# Patient Record
Sex: Male | Born: 1980 | Race: Black or African American | Hispanic: No | Marital: Married | Smoking: Never smoker
Health system: Southern US, Community
[De-identification: ages and names within clinical notes are randomized; demographics above are authoritative.]

## PROBLEM LIST (undated history)

## (undated) DIAGNOSIS — T7840XA Allergy, unspecified, initial encounter: Secondary | ICD-10-CM

## (undated) HISTORY — DX: Allergy, unspecified, initial encounter: T78.40XA

---

## 2001-06-24 ENCOUNTER — Emergency Department (HOSPITAL_COMMUNITY): Admission: EM | Admit: 2001-06-24 | Discharge: 2001-06-24 | Payer: Self-pay | Admitting: Emergency Medicine

## 2001-12-21 ENCOUNTER — Emergency Department (HOSPITAL_COMMUNITY): Admission: EM | Admit: 2001-12-21 | Discharge: 2001-12-21 | Payer: Self-pay

## 2002-01-29 ENCOUNTER — Encounter: Payer: Self-pay | Admitting: Emergency Medicine

## 2002-01-29 ENCOUNTER — Emergency Department (HOSPITAL_COMMUNITY): Admission: EM | Admit: 2002-01-29 | Discharge: 2002-01-29 | Payer: Self-pay | Admitting: Emergency Medicine

## 2005-03-26 ENCOUNTER — Emergency Department (HOSPITAL_COMMUNITY): Admission: EM | Admit: 2005-03-26 | Discharge: 2005-03-26 | Payer: Self-pay | Admitting: Family Medicine

## 2006-11-15 ENCOUNTER — Ambulatory Visit: Payer: Self-pay | Admitting: Internal Medicine

## 2007-02-02 ENCOUNTER — Ambulatory Visit: Payer: Self-pay | Admitting: Internal Medicine

## 2007-02-02 LAB — CONVERTED CEMR LAB
ALT: 18 units/L (ref 0–53)
AST: 29 units/L (ref 0–37)
Albumin: 4.6 g/dL (ref 3.5–5.2)
Basophils Relative: 0.4 % (ref 0.0–1.0)
Bilirubin Urine: NEGATIVE
Calcium: 9.7 mg/dL (ref 8.4–10.5)
Chloride: 104 meq/L (ref 96–112)
Creatinine, Ser: 0.9 mg/dL (ref 0.4–1.5)
Crystals: NEGATIVE
GFR calc non Af Amer: 108 mL/min
HCT: 49.8 % (ref 39.0–52.0)
Hemoglobin, Urine: NEGATIVE
Hemoglobin: 16.8 g/dL (ref 13.0–17.0)
LDL Cholesterol: 98 mg/dL (ref 0–99)
Monocytes Absolute: 0.3 10*3/uL (ref 0.2–0.7)
Neutrophils Relative %: 66.1 % (ref 43.0–77.0)
Nitrite: NEGATIVE
RDW: 11.9 % (ref 11.5–14.6)
Sodium: 140 meq/L (ref 135–145)
Specific Gravity, Urine: 1.02 (ref 1.000–1.03)
TSH: 0.66 microintl units/mL (ref 0.35–5.50)
Total Bilirubin: 2 mg/dL — ABNORMAL HIGH (ref 0.3–1.2)
Total Protein, Urine: NEGATIVE mg/dL
VLDL: 9 mg/dL (ref 0–40)
WBC: 6.3 10*3/uL (ref 4.5–10.5)

## 2010-08-25 NOTE — Assessment & Plan Note (Signed)
[facility]                           PRIMARY CARE OFFICE NOTE   NAME:Albert Larsen, Albert Larsen                 MRN:          [id number]  DATE:11/15/2006                            DOB:          [birth year]    CHIEF COMPLAINT:  New patient practice/allergies.   HISTORY OF PRESENT ILLNESS:  The patient is a 30 year old African male  here to establish primary care.  He is originally from [place] and  moved to [place] from [place] two years  ago.  He currently works as  an Dance movement psychotherapist for [employer].  He denies any major illnesses or  hospitalizations.  He complains of chronic nasal congestion, associated  with sneezing and conjunctivitis. He has tried over-the-counter Claritin  and occasional Zyrtec with some improvement.   CURRENT MEDICATIONS:  As noted above.   ALLERGIES:  No known drug allergies.   SOCIAL HISTORY:  The patient is single.  He denies any tobacco or  alcohol use.   FAMILY HISTORY:  Mother is age 2, healthy.  Father is age 48.  Does not  have any health issues.  He denies any family history of diabetes,  premature heart disease or cancer.   HABITS:  No alcohol, no tobacco.   REVIEW OF SYSTEMS:  No fevers or chills.  HEENT:  Symptoms as noted  above. No history of chronic cough.  No history of asthma.  Denies  persistent heartburn.  No nausea, vomiting, constipation or diarrhea.  All other systems negative.   PHYSICAL EXAMINATION:  VITAL SIGNS:  Height 6 feet, weight 192 pounds,  temperature 97.8 degrees, pulse 71, blood pressure 126/82 in the left  arm in the seated position.  GENERAL:  The patient is a pleasant, well-developed and well-nourished  African male, in no apparent distress.  HEENT:  Normocephalic and atraumatic.  Pupils equal, reactive to light  bilaterally.  Extraocular muscles intact.  The patient was anicteric.  There was bilateral mild conjunctival injection.  Examination of his  nares reveals boggy  nasal turbinates.  Otherwise unremarkable.  Oropharynx also unremarkable.  NECK:  Supple without any evidence of adenopathy or carotid bruits.  CHEST:  Normal expiratory effort.  Chest clear to auscultation  bilaterally.  No rhonchi, rales or wheezing.  CARDIOVASCULAR:  A regular rate and rhythm.  No significant murmurs,  rubs or gallops appreciated.  ABDOMEN:  Soft, nontender.  Positive bowel sounds.  No organomegaly.  MUSCULOSKELETAL:  No clubbing, cyanosis or edema.  SKIN:  Warm and dry.  NEUROLOGIC:  Cranial nerves II-XII  grossly intact.  He is nonfocal.   IMPRESSION:  1. Allergic rhinitis.  2. Health maintenance.   RECOMMENDATIONS:  1. The patient is to regularly take Zyrtec 10 mg over-the-counter      q.h.s.  He was given samples of Nasonex with instructions for two      sprays in each nostril once daily.  2. He will start nasal saline irrigation twice daily.  3. We discussed possible referral to an allergist.  4. Up-to-date with last tetanus given in October 2007.   FOLLOWUP:  To follow up in two to three months.  Barbette Hair. Artist Pais, DO  Electronically Signed    RDY/MedQ  DD: 11/15/2006  DT: 11/16/2006  Job #: [id number]

## 2011-07-25 ENCOUNTER — Ambulatory Visit (INDEPENDENT_AMBULATORY_CARE_PROVIDER_SITE_OTHER): Payer: BC Managed Care – PPO | Admitting: Internal Medicine

## 2011-07-25 VITALS — BP 129/76 | HR 81 | Temp 98.5°F | Resp 16 | Ht 72.0 in | Wt 216.0 lb

## 2011-07-25 DIAGNOSIS — J069 Acute upper respiratory infection, unspecified: Secondary | ICD-10-CM

## 2011-07-25 DIAGNOSIS — J019 Acute sinusitis, unspecified: Secondary | ICD-10-CM

## 2011-07-25 DIAGNOSIS — H9201 Otalgia, right ear: Secondary | ICD-10-CM

## 2011-07-25 DIAGNOSIS — H6120 Impacted cerumen, unspecified ear: Secondary | ICD-10-CM

## 2011-07-25 DIAGNOSIS — H9209 Otalgia, unspecified ear: Secondary | ICD-10-CM

## 2011-07-25 MED ORDER — AMOXICILLIN 875 MG PO TABS: 875.0000 mg | ORAL_TABLET | Freq: Two times a day (BID) | ORAL | Status: AC

## 2011-07-25 NOTE — Progress Notes (Signed)
  Subjective:    Patient ID: Albert Larsen, male    DOB: June 10, 1980, 31 y.o.   MRN: [id number]  HPIRight ear pain with decreased hearing for 5 days Congested for 2 weeks/no response to allergy medicines/has hoarseness but no cough and no sore throat/discharge from nose now purulent    Review of Systems     Objective:   Physical Exam Vital signs stable Right ear canal occluded by dark dry wax Left ear canal clear Manipulation of the right auricle is painful/no swelling Nares are boggy with purulent discharge/bilateral maxillary sinus tenderness to percussion Throat clear No nodes Chest clear      Procedure: Irrigation of the right ear with 75% successful/canal is clear of any infection/TM intact Assessment & Plan:  Problem #1 otalgia secondary to cerumen impaction Problem #2 allergic rhinitis versus URI Problem #3 acute sinusitis  Amoxicillin 875 twice a day for 10 days Claritin-D OTC Mineral drops right ear Followup if not well one week

## 2012-03-28 ENCOUNTER — Encounter: Payer: Self-pay | Admitting: Family Medicine

## 2012-03-28 ENCOUNTER — Ambulatory Visit (INDEPENDENT_AMBULATORY_CARE_PROVIDER_SITE_OTHER): Payer: BC Managed Care – PPO | Admitting: Family Medicine

## 2012-03-28 VITALS — BP 115/75 | HR 80 | Temp 98.6°F | Resp 16 | Ht 72.5 in | Wt 218.6 lb

## 2012-03-28 DIAGNOSIS — F32A Depression, unspecified: Secondary | ICD-10-CM

## 2012-03-28 DIAGNOSIS — G4700 Insomnia, unspecified: Secondary | ICD-10-CM

## 2012-03-28 DIAGNOSIS — F329 Major depressive disorder, single episode, unspecified: Secondary | ICD-10-CM

## 2012-03-28 DIAGNOSIS — F4321 Adjustment disorder with depressed mood: Secondary | ICD-10-CM

## 2012-03-28 MED ORDER — MIRTAZAPINE 15 MG PO TABS: 15.0000 mg | ORAL_TABLET | Freq: Every day | ORAL | Status: DC

## 2012-03-28 NOTE — Patient Instructions (Addendum)

## 2012-03-28 NOTE — Progress Notes (Signed)
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  He comes in with a problem of insomnia for about 4 days.  His mind is racing.  He has had this problem before.  Every 4 to 6 months this happens. Currently married(she lives in [place]), no children. Originally, patient is from [place].  He thinks he is a bit depressed.  He denies suicidal ideation.  Objective:  Spent 15 minutes face to face.  Assessment:  Mild depression with insomnia.  Plan:  Remeron 15 mg qhs.

## 2012-04-11 ENCOUNTER — Telehealth: Payer: Self-pay

## 2012-04-11 NOTE — Telephone Encounter (Signed)
wants Dr Milus Glazier to call about his meds, he took the meds for sleep (Remeron 15mg )and this helped for first few days, but now is not helping, wants to know if something else can be sent to [facility]

## 2012-04-11 NOTE — Telephone Encounter (Signed)
Patient would like to talk with dr Clelia Croft at her earliest [place] [phone]

## 2012-04-13 NOTE — Telephone Encounter (Signed)
Called patient to advise. I have advised patient of Dr Milus Glazier schedule.

## 2012-04-13 NOTE — Telephone Encounter (Signed)
He needs to be seen for this

## 2012-08-18 ENCOUNTER — Ambulatory Visit (INDEPENDENT_AMBULATORY_CARE_PROVIDER_SITE_OTHER): Payer: BC Managed Care – PPO | Admitting: Family Medicine

## 2012-08-18 VITALS — BP 142/78 | HR 76 | Temp 98.9°F | Resp 16 | Ht 72.5 in | Wt 218.0 lb

## 2012-08-18 DIAGNOSIS — H00016 Hordeolum externum left eye, unspecified eyelid: Secondary | ICD-10-CM

## 2012-08-18 DIAGNOSIS — H00019 Hordeolum externum unspecified eye, unspecified eyelid: Secondary | ICD-10-CM

## 2012-08-18 MED ORDER — CIPROFLOXACIN HCL 0.3 % OP SOLN: OPHTHALMIC | Status: DC

## 2012-08-18 NOTE — Progress Notes (Signed)
[facility] [address] [place] [address] [phone]  Date:  08/18/2012   Name:  Albert Larsen   DOB:  [birth year]   MRN:  [id number]  PCP:  Tally Due, MD    Chief Complaint: Stye   History of Present Illness:  Albert Larsen is a 32 y.o. very pleasant male patient who presents with the following:  He is here today with a concern regarding his left eye.  He has noted some itching but no tenderness in the left upper lid for 2 or 3 days.  He is able to feel a bump there.  This reminds him of when he had a stye in the past He has had some itchy eyes recently due to allergies.  Both eyes are watering some.   His vision seems normal to him.   He does not wear corrective lenses.    He is taking zyrtec for allergies.  He is otherwise generally healthy.    There are no active problems to display for this patient.   No past medical history on file.  No past surgical history on file.  History  Substance Use Topics  . Smoking status: Never Smoker   . Smokeless tobacco: Not on file  . Alcohol Use: No    No family history on file.  No Known Allergies  Medication list has been reviewed and updated.  Current Outpatient Prescriptions on File Prior to Visit  Medication Sig Dispense Refill  . mirtazapine (REMERON) 15 MG tablet Take 1 tablet (15 mg total) by mouth at bedtime.  30 tablet  5   No current facility-administered medications on file prior to visit.    Review of Systems:  As per HPI- otherwise negative. He has not tried any treatments as of yet  Physical Examination: Filed Vitals:   08/18/12 1141  BP: 142/78  Pulse: 76  Temp: 98.9 F (37.2 C)  Resp: 16   Filed Vitals:   08/18/12 1141  Height: 6' 0.5" (1.842 m)  Weight: 218 lb (98.884 kg)   Body mass index is 29.14 kg/(m^2). Ideal Body Weight: Weight in (lb) to have BMI = 25: 186.5  GEN: WDWN, NAD, Non-toxic, A & O x 3, looks well HEENT: Atraumatic,  Normocephalic. Neck supple. No masses, No LAD. Bilateral TM wnl, oropharynx normal.  PEERL,EOMI.  There is a small stye visible on the left upper lid.  It appears to be coming to a head.  No conjunctival injection.  Normal fundoscopic exam Ears and Nose: No external deformity. CV: RRR, No M/G/R. No JVD. No thrill. No extra heart sounds. PULM: CTA B, no wheezes, crackles, rhonchi. No retractions. No resp. distress. No accessory muscle use. EXTR: No c/c/e NEURO Normal gait.  PSYCH: Normally interactive. Conversant. Not depressed or anxious appearing.  Calm demeanor.    Assessment and Plan: Stye, left - Plan: ciprofloxacin (CILOXAN) 0.3 % ophthalmic solution  Treat with hot compresses.  rx for cipro drops per his request.  He had used abx drops the last time he had a stye and they seemed to help.  See patient instructions for more details.     Signed Abbe Amsterdam, MD

## 2012-08-18 NOTE — Patient Instructions (Addendum)
Apply hot compresses to your eye frequently.  If your eye is not better in the next couple of days please let me know- Sooner if worse.   Use the eye drop as directed.

## 2012-09-09 ENCOUNTER — Ambulatory Visit (INDEPENDENT_AMBULATORY_CARE_PROVIDER_SITE_OTHER): Payer: BC Managed Care – PPO | Admitting: Family Medicine

## 2012-09-09 VITALS — BP 145/81 | HR 79 | Temp 98.5°F | Resp 18 | Ht 72.0 in | Wt 216.0 lb

## 2012-09-09 DIAGNOSIS — R300 Dysuria: Secondary | ICD-10-CM

## 2012-09-09 LAB — POCT UA - MICROSCOPIC ONLY
Casts, Ur, LPF, POC: NEGATIVE
Crystals, Ur, HPF, POC: NEGATIVE
Epithelial cells, urine per micros: NEGATIVE
Mucus, UA: POSITIVE
Yeast, UA: NEGATIVE

## 2012-09-09 LAB — POCT URINALYSIS DIPSTICK
Bilirubin, UA: NEGATIVE
Glucose, UA: NEGATIVE
Ketones, UA: NEGATIVE
Leukocytes, UA: NEGATIVE
Nitrite, UA: NEGATIVE
Protein, UA: NEGATIVE
Spec Grav, UA: 1.01
Urobilinogen, UA: 0.2
pH, UA: 5.5

## 2012-09-09 MED ORDER — DOXYCYCLINE HYCLATE 100 MG PO CAPS: 100.0000 mg | ORAL_CAPSULE | Freq: Two times a day (BID) | ORAL | Status: DC

## 2012-09-09 MED ORDER — CEFTRIAXONE SODIUM 1 G IJ SOLR
500.0000 mg | Freq: Once | INTRAMUSCULAR | Status: AC
Start: 2012-09-09 — End: 2012-09-09
  Administered 2012-09-09: 500 mg via INTRAMUSCULAR

## 2012-09-09 NOTE — Progress Notes (Signed)
[address]   [place], [place]  [address]   [phone]  Subjective:    Patient ID: Albert Larsen, male    DOB: 1980/07/06, 32 y.o.   MRN: [id number]  HPI This 32 y.o. male presents for evaluation of dysuria.  Onset three days ago.  Prior to urination and after, suffering with pain.  No penile discharge.  Increased fluid intake.  Pain has improved.  No fever but +night sweats chronic.  No chills. No abdominal pain; no n/v.  No hematuria.  No frequency; no urgency.  No testicular pain or swelling.  Sexually active; old partner x 4 years.  History of STD six years ago.  Last STD check four years ago.  No lower back pain.     Review of Systems  Constitutional: Positive for diaphoresis. Negative for fever, chills and fatigue.  Gastrointestinal: Negative for nausea, vomiting, abdominal pain and diarrhea.  Genitourinary: Positive for dysuria. Negative for urgency, frequency, hematuria, flank pain, discharge, penile swelling, scrotal swelling, genital sores, penile pain and testicular pain.  Musculoskeletal: Negative for back pain.    History reviewed. No pertinent past medical history.  History reviewed. No pertinent past surgical history.  Prior to Admission medications   Not on File    No Known Allergies  History   Social History  . Marital Status: Single    Spouse Name: N/A    Number of Children: N/A  . Years of Education: N/A   Occupational History  . Not on file.   Social History Main Topics  . Smoking status: Never Smoker   . Smokeless tobacco: Not on file  . Alcohol Use: No  . Drug Use: No  . Sexually Active: Not on file   Other Topics Concern  . Not on file   Social History Narrative  . No narrative on file    History reviewed. No pertinent family history.     Objective:   Physical Exam  Nursing note and vitals reviewed. Constitutional: He is oriented to person, place, and time. He appears well-developed and well-nourished. No distress.    Cardiovascular: Normal rate, regular rhythm and normal heart sounds.   No murmur heard. Pulmonary/Chest: Effort normal and breath sounds normal.  Abdominal: Soft. Bowel sounds are normal. He exhibits no distension and no mass. There is no tenderness. There is no rebound and no guarding. Hernia confirmed negative in the right inguinal area and confirmed negative in the left inguinal area.  Genitourinary: Penis normal. Right testis shows no mass, no swelling and no tenderness. Right testis is descended. Left testis shows no mass, no swelling and no tenderness. Left testis is descended. No penile tenderness.  Lymphadenopathy:       Right: No inguinal adenopathy present.       Left: No inguinal adenopathy present.  Neurological: He is alert and oriented to person, place, and time.  Skin: He is not diaphoretic.  Psychiatric: He has a normal mood and affect. His behavior is normal.        Results for orders placed in visit on 09/09/12  POCT URINALYSIS DIPSTICK      Result Value Range   Color, UA yellow     Clarity, UA clear     Glucose, UA neg     Bilirubin, UA neg     Ketones, UA neg     Spec Grav, UA 1.010     Blood, UA trace     pH, UA 5.5     Protein,  UA neg     Urobilinogen, UA 0.2     Nitrite, UA neg     Leukocytes, UA Negative    POCT UA - MICROSCOPIC ONLY      Result Value Range   WBC, Ur, HPF, POC 0-1     RBC, urine, microscopic 2-3     Bacteria, U Microscopic trace     Mucus, UA pos     Epithelial cells, urine per micros neg     Crystals, Ur, HPF, POC neg     Casts, Ur, LPF, POC neg     Yeast, UA neg      Assessment & Plan:  Dysuria - Plan: POCT urinalysis dipstick, POCT UA - Microscopic Only, Urine culture, GC/Chlamydia Probe Amp, cefTRIAXone (ROCEPHIN) injection 500 mg, CANCELED: POCT urine pregnancy   1.  Dysuria;  New. Benign u/a in office; send urine culture and Genprobe.  Treat empirically with Rocephin 500mg  IM in office; rx for Doxycycline provided.   Declined further STD screening; has CPE scheduled in upcoming month.  Meds ordered this encounter  Medications  . cefTRIAXone (ROCEPHIN) injection 500 mg    Sig:   . doxycycline (VIBRAMYCIN) 100 MG capsule    Sig: Take 1 capsule (100 mg total) by mouth 2 (two) times daily.    Dispense:  20 capsule    Refill:  0

## 2012-09-09 NOTE — Patient Instructions (Addendum)

## 2012-09-10 LAB — URINE CULTURE: Organism ID, Bacteria: NO GROWTH

## 2012-09-20 ENCOUNTER — Telehealth: Payer: Self-pay

## 2012-09-20 NOTE — Telephone Encounter (Signed)
Dr. Katrinka Blazing, pt said he had been calling for several days for his lab results, so I let him know that everything was neg. Is there anything else that you needed to tell him?

## 2012-09-22 NOTE — Telephone Encounter (Signed)
How is he feeling?  Is he feeling better?  Any further dysuria?

## 2012-09-22 NOTE — Telephone Encounter (Signed)
Pt states that he has been drinking plenty of water and he has not been having any more dysuria since Saturday. Dr Katrinka Blazing, [place].

## 2013-01-03 ENCOUNTER — Other Ambulatory Visit: Payer: Self-pay | Admitting: Physician Assistant

## 2013-01-03 ENCOUNTER — Ambulatory Visit (INDEPENDENT_AMBULATORY_CARE_PROVIDER_SITE_OTHER): Payer: BC Managed Care – PPO | Admitting: Physician Assistant

## 2013-01-03 VITALS — BP 132/80 | HR 85 | Temp 99.7°F | Resp 16 | Ht 73.0 in | Wt 213.0 lb

## 2013-01-03 DIAGNOSIS — E0590 Thyrotoxicosis, unspecified without thyrotoxic crisis or storm: Secondary | ICD-10-CM

## 2013-01-03 DIAGNOSIS — R5383 Other fatigue: Secondary | ICD-10-CM

## 2013-01-03 DIAGNOSIS — Z0000 Encounter for general adult medical examination without abnormal findings: Secondary | ICD-10-CM

## 2013-01-03 DIAGNOSIS — R5381 Other malaise: Secondary | ICD-10-CM

## 2013-01-03 DIAGNOSIS — Z23 Encounter for immunization: Secondary | ICD-10-CM

## 2013-01-03 LAB — POCT URINALYSIS DIPSTICK
Bilirubin, UA: NEGATIVE
Glucose, UA: NEGATIVE
Ketones, UA: NEGATIVE
Leukocytes, UA: NEGATIVE
Nitrite, UA: NEGATIVE
Protein, UA: NEGATIVE
Spec Grav, UA: >=1.03
Urobilinogen, UA: 0.2
pH, UA: 5.5

## 2013-01-03 LAB — POCT CBC
Lymph, poc: 1.4 (ref 0.6–3.4)
MCH, POC: 28.9 pg (ref 27–31.2)
MCHC: 32.5 g/dL (ref 31.8–35.4)
MCV: 89 fL (ref 80–97)
MID (cbc): 0.4 (ref 0–0.9)
MPV: 9.2 fL (ref 0–99.8)
POC LYMPH PERCENT: 30.5 %L (ref 10–50)
POC MID %: 8 %M (ref 0–12)
Platelet Count, POC: 204 10*3/uL (ref 142–424)
RBC: 5.5 M/uL (ref 4.69–6.13)
WBC: 4.6 10*3/uL (ref 4.6–10.2)

## 2013-01-03 LAB — LIPID PANEL
Cholesterol: 122 mg/dL (ref 0–200)
Total CHOL/HDL Ratio: 3.5 Ratio
Triglycerides: 59 mg/dL (ref <150)
VLDL: 12 mg/dL (ref 0–40)

## 2013-01-03 LAB — COMPREHENSIVE METABOLIC PANEL
Alkaline Phosphatase: 79 U/L (ref 39–117)
CO2: 27 mEq/L (ref 19–32)
Creat: 0.84 mg/dL (ref 0.50–1.35)
Glucose, Bld: 104 mg/dL — ABNORMAL HIGH (ref 70–99)
Total Bilirubin: 0.7 mg/dL (ref 0.3–1.2)

## 2013-01-03 LAB — POCT UA - MICROSCOPIC ONLY
Casts, Ur, LPF, POC: NEGATIVE
Mucus, UA: POSITIVE
Yeast, UA: NEGATIVE

## 2013-01-03 NOTE — Progress Notes (Signed)
I have examined this patient along with the student and agree. Age appropriate anticipatory guidance provided. Would add HIV testing if fatigue persists.

## 2013-01-03 NOTE — Patient Instructions (Signed)
I will contact you with your lab results as soon as they are available.   If you have not heard from me in 2 weeks, please contact me.  The fastest way to get your results is to register for My Chart (see the instructions on the last page of this printout).  Keeping you healthy  Get these tests  Blood pressure- Have your blood pressure checked once a year by your healthcare provider.  Normal blood pressure is 120/80.  Weight- Have your body mass index (BMI) calculated to screen for obesity.  BMI is a measure of body fat based on height and weight. You can also calculate your own BMI at www.nhlbisupport.com/bmi/.  Cholesterol- Have your cholesterol checked regularly starting at age 35, sooner may be necessary if you have diabetes, high blood pressure, if a family member developed heart diseases at an early age or if you smoke.   Chlamydia, HIV, and other sexual transmitted disease- Get screened each year until the age of 25 then within three months of each new sexual partner.  Diabetes- Have your blood sugar checked regularly if you have high blood pressure, high cholesterol, a family history of diabetes or if you are overweight.  Get these vaccines  Flu shot- Every fall.  Tetanus shot- Every 10 years.  Menactra- Single dose; prevents meningitis.  Take these steps  Don't smoke- If you do smoke, ask your healthcare provider about quitting. For tips on how to quit, go to www.smokefree.gov or call 1-800-QUIT-NOW.  Be physically active- Exercise 5 days a week for at least 30 minutes.  If you are not already physically active start slow and gradually work up to 30 minutes of moderate physical activity.  Examples of moderate activity include walking briskly, mowing the yard, dancing, swimming bicycling, etc.  Eat a healthy diet- Eat a variety of healthy foods such as fruits, vegetables, low fat milk, low fat cheese, yogurt, lean meats, poultry, fish, beans, tofu, etc.  For more information  on healthy eating, go to www.thenutritionsource.org  Drink alcohol in moderation- Limit alcohol intake two drinks or less a day.  Never drink and drive.  Dentist- Brush and floss teeth twice daily; visit your dentis twice a year.  Depression-Your emotional health is as important as your physical health.  If you're feeling down, losing interest in things you normally enjoy please talk with your healthcare provider.  Gun Safety- If you keep a gun in your home, keep it unloaded and with the safety lock on.  Bullets should be stored separately.  Helmet use- Always wear a helmet when riding a motorcycle, bicycle, rollerblading or skateboarding.  Safe sex- If you may be exposed to a sexually transmitted infection, use a condom  Seat belts- Seat bels can save your life; always wear one.  Smoke/Carbon Monoxide detectors- These detectors need to be installed on the appropriate level of your home.  Replace batteries at least once a year.  Skin Cancer- When out in the sun, cover up and use sunscreen SPF 15 or higher.  Violence- If anyone is threatening or hurting you, please tell your healthcare provider. 

## 2013-01-03 NOTE — Progress Notes (Signed)
Subjective:    Patient ID: Albert Larsen, male    DOB: 1980-06-08, 32 y.o.   MRN: [id number]  HPI  [name] is here today for a CPE. He has no known medical conditions and takes no medications daily. He suffers from seasonal allergies but has not had to take anything for his allergies this fall. He notes some ongoing fatigue for the past few weeks. He states that his sleeping pattern is normal but feels tired all the time. Patient denies changes in mood but does state that he has only been going to work and then home lately. He believes he is overall happy but doesn't feel motivated to do more lately.   Patient also complains of b/l knee pain. Pain is more of an ache or pressure. He does not remember injuring his knees or anything that precipitated the knee pain. He has not taken anything to help. He has not been exercising lately either.  [name] denies smoking, alcohol, and illicit drug use. He is currently sexually active with one partner. He has no concerns for STIs. Patient had recent G/C testing on 09/09/12 that was negative.   Family medical history is limited since his parents live in another country but he believes that they are healthy.   Review of Systems  Constitutional: Positive for appetite change and fatigue. Negative for chills and unexpected weight change.  HENT: Positive for congestion. Negative for hearing loss, ear pain, sore throat, rhinorrhea, sneezing, mouth sores, neck pain, neck stiffness, dental problem, postnasal drip and sinus pressure.   Respiratory: Negative for cough, chest tightness and shortness of breath.   Cardiovascular: Negative for chest pain and palpitations.  Gastrointestinal: Negative for nausea, vomiting, abdominal pain, diarrhea, constipation and blood in stool.  Genitourinary: Negative for dysuria, urgency, frequency, hematuria and discharge.  Musculoskeletal: Positive for arthralgias. Negative for back pain, joint swelling and  gait problem.  Skin: Negative for color change and rash.  Allergic/Immunologic: Positive for environmental allergies. Negative for food allergies.  Neurological: Negative for weakness, light-headedness, numbness and headaches.  Psychiatric/Behavioral: Negative for suicidal ideas, sleep disturbance and dysphoric mood. The patient is not nervous/anxious.     Filed Vitals:   01/03/13 0833  BP: 132/80  Pulse: 85  Temp: 99.7 F (37.6 C)  TempSrc: Oral  Resp: 16  Height: 6\' 1"  (1.854 m)  Weight: 213 lb (96.616 kg)  SpO2: 100%     Objective:   Physical Exam  Constitutional: He is oriented to person, place, and time. He appears well-developed and well-nourished.  HENT:  Head: Normocephalic and atraumatic.  Right Ear: Hearing normal. No drainage or tenderness. Right ear foreign body: moderate cerumen in ear canal.  Left Ear: Hearing, tympanic membrane and external ear normal. No drainage or tenderness.  Nose: Nose normal. No rhinorrhea.  Mouth/Throat: Uvula is midline, oropharynx is clear and moist and mucous membranes are normal.  Eyes: Conjunctivae and EOM are normal. Pupils are equal, round, and reactive to light. Right eye exhibits no discharge. Left eye exhibits no discharge. No scleral icterus.  Neck: Trachea normal and normal range of motion. Carotid bruit is not present. No mass present.  Cardiovascular: Normal rate, regular rhythm, S1 normal, S2 normal, normal heart sounds, intact distal pulses and normal pulses.   Pulmonary/Chest: Effort normal and breath sounds normal.  Abdominal: Soft. Normal appearance and bowel sounds are normal. There is no hepatosplenomegaly. There is no tenderness. No hernia. Hernia confirmed negative in the right inguinal area and  confirmed negative in the left inguinal area.  Genitourinary: Testes normal and penis normal. Right testis shows no mass and no tenderness. Left testis shows no mass and no tenderness. Circumcised. No penile tenderness. No  discharge found.  Musculoskeletal: Normal range of motion.       Right knee: Normal. He exhibits no swelling, no effusion, no LCL laxity, normal patellar mobility and no MCL laxity. No tenderness found.       Left knee: Normal. He exhibits normal range of motion, no swelling, no effusion, no erythema, no LCL laxity and no MCL laxity.  Lymphadenopathy:    He has no cervical adenopathy.       Right: No inguinal adenopathy present.       Left: No inguinal adenopathy present.  Neurological: He is alert and oriented to person, place, and time. He has normal strength. No cranial nerve deficit or sensory deficit. He displays a negative Romberg sign. Coordination normal.  Reflex Scores:      Tricep reflexes are 2+ on the right side and 2+ on the left side.      Brachioradialis reflexes are 2+ on the right side and 2+ on the left side. Unable to elicit patellar reflexes b/l          Assessment & Plan:  Routine general medical examination at a health care facility - Plan: POCT UA - Microscopic Only, POCT urinalysis dipstick, Lipid panel  Fatigue - Plan: POCT CBC, TSH, Comprehensive metabolic panel, POCT glucose (manual entry)  Need for influenza vaccination - Plan: Flu Vaccine QUAD 36+ mos IM  Need for Tdap vaccination - Plan: Tdap vaccine greater than or equal to 7yo IM

## 2013-01-06 ENCOUNTER — Telehealth: Payer: Self-pay

## 2013-01-06 LAB — T3, FREE: T3, Free: 9.8 pg/mL — ABNORMAL HIGH (ref 2.3–4.2)

## 2013-01-06 LAB — T4: T4, Total: 13.1 ug/dL — ABNORMAL HIGH (ref 5.0–12.5)

## 2013-01-06 NOTE — Telephone Encounter (Signed)
I let him know that some of labs were still pending and that we would call him as soon as everything was back. He asked about STD and I relayed to him that he said he had had no concerns at his OV. However, he does want and HIV if we can add that on? Thanks

## 2013-01-06 NOTE — Telephone Encounter (Signed)
Patient is calling about his lab results. [phone]

## 2013-01-08 LAB — HIV ANTIBODY (ROUTINE TESTING W REFLEX): HIV: NONREACTIVE

## 2013-01-08 NOTE — Telephone Encounter (Signed)
See other message in result notes.  Added on HIV, needs thyroid US and referral to endocrinology due to hyperthyroidism.  Orders placed.

## 2013-01-08 NOTE — Addendum Note (Signed)
Addended by: Fernande Bras on: 01/08/2013 08:32 AM   Modules accepted: Orders

## 2013-01-12 ENCOUNTER — Ambulatory Visit
Admission: RE | Admit: 2013-01-12 | Discharge: 2013-01-12 | Disposition: A | Discharge status: Home / Self Care | Payer: BC Managed Care – PPO | Source: Ambulatory Visit | Attending: Physician Assistant | Admitting: Physician Assistant

## 2013-01-12 DIAGNOSIS — E0590 Thyrotoxicosis, unspecified without thyrotoxic crisis or storm: Secondary | ICD-10-CM

## 2013-01-15 ENCOUNTER — Encounter: Payer: Self-pay | Admitting: Endocrinology

## 2013-01-15 ENCOUNTER — Other Ambulatory Visit: Payer: Self-pay | Admitting: Endocrinology

## 2013-01-15 ENCOUNTER — Ambulatory Visit (INDEPENDENT_AMBULATORY_CARE_PROVIDER_SITE_OTHER): Payer: BC Managed Care – PPO | Admitting: Endocrinology

## 2013-01-15 VITALS — BP 116/70 | HR 67 | Temp 98.4°F | Resp 12 | Ht 72.0 in | Wt 213.6 lb

## 2013-01-15 DIAGNOSIS — E0590 Thyrotoxicosis, unspecified without thyrotoxic crisis or storm: Secondary | ICD-10-CM

## 2013-01-15 DIAGNOSIS — R7301 Impaired fasting glucose: Secondary | ICD-10-CM

## 2013-01-15 LAB — T4, FREE: Free T4: 0.98 ng/dL (ref 0.80–1.80)

## 2013-01-15 NOTE — Progress Notes (Signed)
Patient ID: Albert Larsen, male   DOB: 22-Apr-1980, 32 y.o.   MRN: [id number]  Reason for Appointment:  Hyperthyroidism, new visit    History of Present Illness:   The Hyperthyroidism was first diagnosed in 9/14 on routine lab work for his physical   The symptoms consistent with hyperthyroidism are: Some sweating at night and feeling tired at times. He does not have any  palpitations, shakiness, nervousness, sensation of feeling excessively warm or having diarrhea. The patient has had a slow weight gain over the last few months Also did not have any history of neck discomfort or swelling recently  His labs are indicative of hyperthyroidism.  Lab Results  Component Value Date   TSH 0.033* 01/03/2013   T4TOTAL 13.1* 01/03/2013   He is now referred here for further evaluation.   No visits with results within 1 Week(s) from this visit. Latest known visit with results is:  Office Visit on 01/03/2013  Component Date Value Range Status  . WBC 01/03/2013 4.6  4.6 - 10.2 K/uL Final  . Lymph, poc 01/03/2013 1.4  0.6 - 3.4 Final  . POC LYMPH PERCENT 01/03/2013 30.5  10 - 50 %L Final  . MID (cbc) 01/03/2013 0.4  0 - 0.9 Final  . POC MID % 01/03/2013 8.0  0 - 12 %M Final  . POC Granulocyte 01/03/2013 2.8  2 - 6.9 Final  . Granulocyte percent 01/03/2013 61.5  37 - 80 %G Final  . RBC 01/03/2013 5.50  4.69 - 6.13 M/uL Final  . Hemoglobin 01/03/2013 15.9  14.1 - 18.1 g/dL Final  . HCT, POC 46/96/2952 48.9  43.5 - 53.7 % Final  . MCV 01/03/2013 89.0  80 - 97 fL Final  . MCH, POC 01/03/2013 28.9  27 - 31.2 pg Final  . MCHC 01/03/2013 32.5  31.8 - 35.4 g/dL Final  . RDW, POC 84/13/2440 13.9   Final  . Platelet Count, POC 01/03/2013 204  142 - 424 K/uL Final  . MPV 01/03/2013 9.2  0 - 99.8 fL Final  . TSH 01/03/2013 0.033* 0.350 - 4.500 uIU/mL Final  . WBC, Ur, HPF, POC 01/03/2013 0-2   Final  . RBC, urine, microscopic 01/03/2013 0-4   Final  . Bacteria, U Microscopic 01/03/2013 Trace    Corrected  . Mucus, UA 01/03/2013 Positive   Final  . Epithelial cells, urine per micros 01/03/2013 0-3   Final  . Crystals, Ur, HPF, POC 01/03/2013 Triple Phosphate   Final  . Casts, Ur, LPF, POC 01/03/2013 Neg   Final  . Yeast, UA 01/03/2013 Neg   Final  . Color, UA 01/03/2013 yellow   Final  . Clarity, UA 01/03/2013 clear   Final  . Glucose, UA 01/03/2013 neg   Final  . Bilirubin, UA 01/03/2013 neg   Final  . Ketones, UA 01/03/2013 neg   Final  . Spec Grav, UA 01/03/2013 >=1.030   Final  . Blood, UA 01/03/2013 small   Final  . pH, UA 01/03/2013 5.5   Final  . Protein, UA 01/03/2013 neg   Final  . Urobilinogen, UA 01/03/2013 0.2   Final  . Nitrite, UA 01/03/2013 neg   Final  . Leukocytes, UA 01/03/2013 Negative   Final  . Sodium 01/03/2013 139  135 - 145 mEq/L Final  . Potassium 01/03/2013 4.1  3.5 - 5.3 mEq/L Final  . Chloride 01/03/2013 106  96 - 112 mEq/L Final  . CO2 01/03/2013 27  19 - 32 mEq/L Final  .  Glucose, Bld 01/03/2013 104* 70 - 99 mg/dL Final  . BUN 16/01/9603 12  6 - 23 mg/dL Final  . Creat 54/12/8117 0.84  0.50 - 1.35 mg/dL Final  . Total Bilirubin 01/03/2013 0.7  0.3 - 1.2 mg/dL Final  . Alkaline Phosphatase 01/03/2013 79  39 - 117 U/L Final  . AST 01/03/2013 17  0 - 37 U/L Final  . ALT 01/03/2013 27  0 - 53 U/L Final  . Total Protein 01/03/2013 7.8  6.0 - 8.3 g/dL Final  . Albumin 14/78/2956 4.6  3.5 - 5.2 g/dL Final  . Calcium 21/30/8657 9.8  8.4 - 10.5 mg/dL Final  . Cholesterol 84/69/6295 122  0 - 200 mg/dL Final   Comment: ATP III Classification:                                < 200        mg/dL        Desirable                               200 - 239     mg/dL        Borderline High                               >= 240        mg/dL        High                             . Triglycerides 01/03/2013 59  <150 mg/dL Final  . HDL 28/41/3244 35* >39 mg/dL Final  . Total CHOL/HDL Ratio 01/03/2013 3.5   Final  . VLDL 01/03/2013 12  0 - 40 mg/dL Final  . LDL  Cholesterol 01/03/2013 75  0 - 99 mg/dL Final   Comment:                            Total Cholesterol/HDL Ratio:CHD Risk                                                 Coronary Heart Disease Risk Table                                                                 Men       Women                                   1/2 Average Risk              3.4        3.3  Average Risk              5.0        4.4                                    2X Average Risk              9.6        7.1                                    3X Average Risk             23.4       11.0                          Use the calculated Patient Ratio above and the CHD Risk table                           to determine the patient's CHD Risk.                          ATP III Classification (LDL):                                < 100        mg/dL         Optimal                               100 - 129     mg/dL         Near or Above Optimal                               130 - 159     mg/dL         Borderline High                               160 - 189     mg/dL         High                                > 190        mg/dL         Very High                             . POC Glucose 01/03/2013 108* 70 - 99 mg/dl Final      Medication List    Notice As of 01/15/2013  9:46 AM   You have not been prescribed any medications.          Past Medical History  Diagnosis Date  . Allergy     History reviewed. No pertinent past surgical history.  History reviewed. No pertinent family history.  Social History:  reports that he has never smoked. He does not have any  smokeless tobacco history on file. He reports that he does not drink alcohol or use illicit drugs.  Allergies: No Known Allergies  Review of Systems:  CARDIOLOGY: no history of high blood pressure.       RESPIRATORY:  no dyspnea on exertion.      GASTROENTEROLOGY:  no Change in bowel habits.      ENDOCRINOLOGY:  no history of  Diabetes but his fasting glucose is 104 No numbness or attending in his feet              Examination:   BP 116/70  Pulse 67  Temp(Src) 98.4 F (36.9 C)  Resp 12  Ht 6' (1.829 m)  Wt 213 lb 9.6 oz (96.888 kg)  BMI 28.96 kg/m2  SpO2 97%   General Appearance:  well-built and nourished, pleasant, not anxious or hyperkinetic..        Eyes: No excessive prominence, lid lag or stare. No swelling of the eyelids  Neck: The thyroid is barely palpable on the right side, soft and no nodules felt on either side. There is no lymphadenopathy .          Heart: normal S1 and S2, no murmurs .         Lungs: breath sounds are clear bilaterally  Extremities: hands are warm. No ankle edema. Neurological: REFLEXES: at biceps are difficult to elicit.  TREMORS:  no tremors are present..    Assessments/Plan    Hyperthyroidism, subclinical with minimal or no symptoms  He has a relatively high free T3 level and only mild increase in total T4 The differential diagnosis includes early Graves' disease, toxic nodule or silent thyroiditis  The thyroid nodule on the right side is relatively small and usually small nodules are not able to cause hyperthyroidism. Will need to distinguish Graves' disease from other causes by doing a technetium nuclear scan and also get an I-131 uptake Treatment will depend on the results of above testing  Thyroid nodule: If this is cold will discuss the possibility of needle aspiration with radiologist for definitive diagnosis  IMPAIRED fasting glucose: He has mild increase in fasting glucose, apparently this is not new. Discussed role of regular exercise and diet as well as weight loss in managing this    Margret Moat 01/15/2013, 9:46 AM

## 2013-01-16 ENCOUNTER — Telehealth: Payer: Self-pay | Admitting: *Deleted

## 2013-01-16 NOTE — Telephone Encounter (Signed)
noted 

## 2013-01-16 NOTE — Telephone Encounter (Signed)
Pt wanted to know his results from last visit

## 2013-01-16 NOTE — Progress Notes (Signed)
Quick Note:  Please let patient know that the thyroid result is normal now and no treatment will be needed but I will need to discuss his ultrasound with him, left message on cell phone ______

## 2013-01-16 NOTE — Telephone Encounter (Signed)
Message copied by Hermenia Bers on Tue Jan 16, 2013  2:22 PM ------      Message from: Reather Littler      Created: Tue Jan 16, 2013  1:29 PM       Please let patient know that the thyroid result is normal now and no treatment will be needed but I will need to discuss his ultrasound with him, left message on cell phone ------

## 2013-01-17 NOTE — Telephone Encounter (Signed)
error 

## 2013-01-17 NOTE — Telephone Encounter (Signed)
There is a result note to advise him that the [place] of his neck did not reveal a cause of his overactive thyroid gland.  I see that he has already seen endocrinology.  I have only seen him once, so I think he may be wanting information about the visit with endocrinology?

## 2013-01-17 NOTE — Telephone Encounter (Signed)
Sorry, Albert Larsen he wanted his lab results from September.  I doubled checked with him to see if this is what he had wanted yesterday and he said this is what he wanted.

## 2013-01-17 NOTE — Telephone Encounter (Signed)
Looks like Dr Lucianne Muss has called patient, and is working on this already. There is a note from him.

## 2013-01-17 NOTE — Telephone Encounter (Signed)
So, did the patient contact us in error?

## 2013-01-17 NOTE — Telephone Encounter (Signed)
Ah, I thought we'd already notified him, but with the add ons and [place], I see that we didn't.  Please apologize to him for me.  Labs included: HIV NEGATIVE  TSH LOW, T3 HIGH (9.8), T4 HIGH (13.1)  CHOLESTEROL: OK overall, though HDL "good type" is lower than recommended, and exercise can improve it.  His kidney and liver tests were normal, except that his glucose was mildly elevated.  That should be rechecked, fasting, in the next 1-3 months.  The POCT were reviewed with him while he was here.

## 2013-01-18 NOTE — Telephone Encounter (Signed)
Patient advised of lab results

## 2013-01-31 ENCOUNTER — Ambulatory Visit (HOSPITAL_COMMUNITY): Payer: BC Managed Care – PPO

## 2013-02-01 ENCOUNTER — Other Ambulatory Visit (HOSPITAL_COMMUNITY): Payer: BC Managed Care – PPO

## 2013-02-08 ENCOUNTER — Other Ambulatory Visit: Payer: BC Managed Care – PPO

## 2013-02-12 ENCOUNTER — Ambulatory Visit: Payer: BC Managed Care – PPO | Admitting: Endocrinology

## 2013-06-30 ENCOUNTER — Encounter (HOSPITAL_COMMUNITY): Payer: Self-pay | Admitting: Emergency Medicine

## 2013-06-30 ENCOUNTER — Other Ambulatory Visit (HOSPITAL_COMMUNITY)
Admission: RE | Admit: 2013-06-30 | Discharge: 2013-06-30 | Disposition: A | Discharge status: Home / Self Care | Payer: BC Managed Care – PPO | Source: Ambulatory Visit | Attending: Family Medicine | Admitting: Family Medicine

## 2013-06-30 ENCOUNTER — Emergency Department (HOSPITAL_COMMUNITY)
Admission: EM | Admit: 2013-06-30 | Discharge: 2013-06-30 | Disposition: A | Discharge status: Home / Self Care | Payer: BC Managed Care – PPO | Source: Home / Self Care | Attending: Family Medicine | Admitting: Family Medicine

## 2013-06-30 DIAGNOSIS — R369 Urethral discharge, unspecified: Secondary | ICD-10-CM

## 2013-06-30 DIAGNOSIS — Z113 Encounter for screening for infections with a predominantly sexual mode of transmission: Secondary | ICD-10-CM | POA: Insufficient documentation

## 2013-06-30 MED ORDER — LIDOCAINE HCL (PF) 1 % IJ SOLN
INTRAMUSCULAR | Status: AC
  Filled 2013-06-30: qty 5

## 2013-06-30 MED ORDER — CEFTRIAXONE SODIUM 250 MG IJ SOLR
250.0000 mg | Freq: Once | INTRAMUSCULAR | Status: AC
  Administered 2013-06-30: 250 mg via INTRAMUSCULAR

## 2013-06-30 MED ORDER — AZITHROMYCIN 250 MG PO TABS
1000.0000 mg | ORAL_TABLET | Freq: Once | ORAL | Status: AC
  Administered 2013-06-30: 1000 mg via ORAL

## 2013-06-30 MED ORDER — CEFTRIAXONE SODIUM 250 MG IJ SOLR
INTRAMUSCULAR | Status: AC
  Filled 2013-06-30: qty 250

## 2013-06-30 MED ORDER — AZITHROMYCIN 250 MG PO TABS
ORAL_TABLET | ORAL | Status: AC
  Filled 2013-06-30: qty 4

## 2013-06-30 NOTE — Discharge Instructions (Signed)
Thank you for coming in today. We will call you with test results. Obtain HIV and syphilis testing in the near future as we discussed. Come back as needed.

## 2013-06-30 NOTE — ED Provider Notes (Signed)
Albert Larsen is a 33 y.o. male who presents to Urgent Care today for penile discharge started today. No fevers chills nausea vomiting diarrhea or abdominal pain. No significant dysuria or urinary frequency or urgency. Consistent with previous episode of gonorrhea. Patient is actually active with women. He feels well otherwise.   Past Medical History  Diagnosis Date  . Allergy    History  Substance Use Topics  . Smoking status: Never Smoker   . Smokeless tobacco: Not on file  . Alcohol Use: No   ROS as above Medications: Current Facility-Administered Medications  Medication Dose Route Frequency Provider Last Rate Last Dose  . azithromycin (ZITHROMAX) tablet 1,000 mg  1,000 mg Oral Once Rodolph BongEvan S Pinky Ravan, MD      . cefTRIAXone (ROCEPHIN) injection 250 mg  250 mg Intramuscular Once Rodolph BongEvan S Adanya Sosinski, MD       No current outpatient prescriptions on file.    Exam:  BP 134/80  Pulse 75  Temp(Src) 98.5 F (36.9 C) (Oral)  Resp 18  SpO2 99% Gen: Well NAD GENITALS: Normal appearing circumcised penis without lesions. Clear discharge present. Testicles are descended bilaterally and nontender.   Assessment and Plan: 33 y.o. male with penile discharge. Cytology pending. Empiric treatment with azithromycin and ceftriaxone. Recommend HIV and syphilis testing at his next wellness visit which is in a few weeks.  Discussed warning signs or symptoms. Please see discharge instructions. Patient expresses understanding.    Rodolph BongEvan S Savannaha Stonerock, MD 06/30/13 1049

## 2013-06-30 NOTE — ED Notes (Signed)
Pt c/o white penile d/c onset this am Denies abd pain, dysuria, hematuria, f/v/n/d He is alert w/no signs of acute distress.

## 2013-07-02 LAB — URINE CYTOLOGY ANCILLARY ONLY
Chlamydia: POSITIVE — AB
Neisseria Gonorrhea: NEGATIVE
Trichomonas: NEGATIVE

## 2013-07-03 NOTE — ED Notes (Signed)
GC and Trich neg., Chlamydia pos.  Pt. adequately treated with Zithromax and Rocephin. DHHS [facility]. Vassie MoselleYork, Raechel Marcos M 07/03/2013

## 2013-07-04 NOTE — ED Notes (Signed)
Patient called to inquire about lab reports. After verifying ID, discussed positive chlamydia findings. WAS ADVISED TO INFORM PARTNERS, SO THEY MAY ALSO BE TREATED . Patient verbalized understanding

## 2013-07-05 ENCOUNTER — Telehealth (HOSPITAL_COMMUNITY): Payer: Self-pay | Admitting: *Deleted

## 2013-07-05 NOTE — ED Notes (Signed)
Pt. called and said he is still having discharge off and on.  I told him we recommend no sex for  1 week after the medication to be sure it is gone.  Pt. denies having any sex since treatment. If still having symptoms after 7/28 to get rechecked.  Pt. voiced understanding. Vassie MoselleYork, Albert Larsen 07/05/2013

## 2013-07-07 ENCOUNTER — Ambulatory Visit (INDEPENDENT_AMBULATORY_CARE_PROVIDER_SITE_OTHER): Payer: BC Managed Care – PPO | Admitting: Internal Medicine

## 2013-07-07 VITALS — BP 118/86 | HR 72 | Temp 98.3°F | Resp 16 | Ht 73.0 in | Wt 219.8 lb

## 2013-07-07 DIAGNOSIS — A749 Chlamydial infection, unspecified: Secondary | ICD-10-CM

## 2013-07-07 DIAGNOSIS — R369 Urethral discharge, unspecified: Secondary | ICD-10-CM

## 2013-07-07 LAB — POCT UA - MICROSCOPIC ONLY
Bacteria, U Microscopic: NEGATIVE
Casts, Ur, LPF, POC: NEGATIVE
Crystals, Ur, HPF, POC: NEGATIVE
EPITHELIAL CELLS, URINE PER MICROSCOPY: NEGATIVE
MUCUS UA: NEGATIVE
RBC, URINE, MICROSCOPIC: NEGATIVE
WBC, Ur, HPF, POC: NEGATIVE
YEAST UA: NEGATIVE

## 2013-07-07 LAB — POCT URINALYSIS DIPSTICK
BILIRUBIN UA: NEGATIVE
GLUCOSE UA: NEGATIVE
KETONES UA: NEGATIVE
Leukocytes, UA: NEGATIVE
Nitrite, UA: NEGATIVE
PROTEIN UA: NEGATIVE
SPEC GRAV UA: 1.01
Urobilinogen, UA: 0.2
pH, UA: 6

## 2013-07-07 NOTE — Progress Notes (Addendum)
Subjective:   This chart was scribed for Albert Siaobert Jeanclaude Wentworth, MD by Albert [facility]en, [facility] Scribe. This patient was seen in room 4 and the patient's Larsen was started 9:38 AM.    Patient ID: Albert Larsen, male    DOB: 09/07/1980, 33 y.o.   MRN: [id number]  Chief Complaint  Patient presents with  . Follow-up    STI check  . Penile Discharge    HPI  HPI Comments: Albert Larsen is a 33 y.o. male who presents to [facility] for STI follow-up today. [facility] for pen[facility] discharge. Pt was treated with Rocephin and prescribed Zithromax prior to discharge, which she took. Subsequently he tested positive only for Chlamydia with negative trichomonas and negative gonorrhea .Pt states he is still experiencing penile discharge, but only on arising in the morning. He denies any dysuria at this time. He states he is aware of who he contracted Chlamydia from. Denies any sexual contact with that individual or anyone else since onset of symptoms. At this time he denies any other associated symptoms. No other concerns this visit. No testicular pain  Past Medical History  Diagnosis Date  . Allergy     Review of Systems  Constitutional: Negative for fever and chills.  HENT: Negative for congestion.   Eyes: Negative for redness.  Respiratory: Negative for cough.   Genitourinary: Positive for discharge. Negative for dysuria.  Skin: Negative for rash.  Psychiatric/Behavioral: Negative for confusion.    Triage Vitals: BP 118/86  Pulse 72  Temp(Src) 98.3 F (36.8 C) (Oral)  Resp 16  Ht 6\' 1"  (1.854 m)  Wt 219 lb 12.8 oz (99.701 kg)  BMI 29.01 kg/m2  SpO2 100%   Objective:   Physical Exam  Nursing note and vitals reviewed. Constitutional: He is oriented to person, place, and time. He appears well-developed and well-nourished.  HENT:  Head: Normocephalic and atraumatic.  Eyes: EOM are normal.  Neck: Normal range of  motion.  Cardiovascular: Normal rate.   Pulmonary/Chest: Effort normal.  Musculoskeletal: Normal range of motion.  Neurological: He is alert and oriented to person, place, and time.  Skin: Skin is warm and dry.  Psychiatric: He has a normal mood and affect. His behavior is normal.    Results for orders placed in visit on 07/07/13  POCT URINALYSIS DIPSTICK      Result Value Ref Range   Color, UA lt yellow     Clarity, UA clear     Glucose, UA neg     Bilirubin, UA neg     Ketones, UA neg     Spec Grav, UA 1.010     Blood, UA trace-lysed     pH, UA 6.0     Protein, UA neg     Urobilinogen, UA 0.2     Nitrite, UA neg     Leukocytes, UA Negative    POCT UA - MICROSCOPIC ONLY      Result Value Ref Range   WBC, Ur, HPF, POC neg     RBC, urine, microscopic neg     Bacteria, U Microscopic neg     Mucus, UA neg     Epithelial cells, urine per micros neg     Crystals, Ur, HPF, POC neg     Casts, Ur, LPF, POC neg     Yeast, UA neg           Assessment & Plan:  Penile discharge - Plan: GC/Chlamydia Probe Amp, POCT urinalysis dipstick, POCT UA - Microscopic Only  Chlamydia infection - Plan: GC/Chlamydia Probe Amp   Reassured that early-morning discharge can be normal Will call with lab  I personally performed the services described in this documentation, which was scribed in my presence. The recorded information has been reviewed and is accurate.I have completed the patient encounter in its entirety as documented by the scribe, with editing by me where necessary. Albert Larsen Albert Larsen, M.D.       Addend- chlam still pos(and maybe the test should have been delayed for 3 weeks)--for his peace of mind we will retreat and use doxy Meds ordered this encounter  Medications  . doxycycline (VIBRA-TABS) 100 MG tablet    Sig: Take 1 tablet (100 mg total) by mouth 2 (two) times daily.    Dispense:  14 tablet    Refill:  0

## 2013-07-09 LAB — GC/CHLAMYDIA PROBE AMP
CT Probe RNA: POSITIVE — AB
GC Probe RNA: NEGATIVE

## 2013-07-09 MED ORDER — DOXYCYCLINE HYCLATE 100 MG PO TABS: 100.0000 mg | ORAL_TABLET | Freq: Two times a day (BID) | ORAL | Status: DC

## 2013-07-09 NOTE — Addendum Note (Signed)
Addended by: Tonye PearsonOLITTLE, Gurman Ashland P on: 07/09/2013 07:00 PM   Modules accepted: Orders

## 2013-08-01 ENCOUNTER — Ambulatory Visit (INDEPENDENT_AMBULATORY_CARE_PROVIDER_SITE_OTHER): Payer: BC Managed Care – PPO | Admitting: Family Medicine

## 2013-08-01 VITALS — BP 135/85 | HR 90 | Temp 98.3°F | Resp 16 | Ht 70.5 in | Wt 222.0 lb

## 2013-08-01 DIAGNOSIS — F39 Unspecified mood [affective] disorder: Secondary | ICD-10-CM

## 2013-08-01 DIAGNOSIS — R4586 Emotional lability: Secondary | ICD-10-CM

## 2013-08-01 DIAGNOSIS — R41840 Attention and concentration deficit: Secondary | ICD-10-CM

## 2013-08-01 MED ORDER — ATOMOXETINE HCL 40 MG PO CAPS: 40.0000 mg | ORAL_CAPSULE | Freq: Every day | ORAL | Status: DC

## 2013-08-01 NOTE — Progress Notes (Signed)
Chief Complaint:  Chief Complaint  Patient presents with  . Follow-up    lab work-positive chlamydia  . other    irritated, impatient personality traits he would like to discuss    HPI: Albert Larsen is a 33 y.o. male who is here for attention issues, very unorganized, unable to focus, procrastinating, x  > 10 years.  He feels very mpatient, recently in a shouting match in a meeting with co-worker. This happens regularly. Had an outburst today, interrupts people although it may not be his turn to speak.  Has taken adderall in the past. This did help with symptoms. He felt he was better therefore he stopped taking the medication. Albert Larsen has had these issues for the last 10 years. About 7 years ago he was on Adderall.   He is an Dance movement psychotherapistactuary. He is having difficulties at work, his wife states he does not finish things. HIs main problems is he states he will do them and he has a lot of time to do it but then he procrastinates and then does not complete them. He deneis being anxious or depressed but he is not sure. He is upset because he can;t get things done. He deneis mania, however he is here telling me he just purchased a planbe ticket to [place] to get awy for abotu 2 weeks. He ahs not discussed this with his wife. He denies any SI/HI/halluciantions or bipolar. He has never had days on ends where he does not sleep, he deneis grandiouse thougts. He states he is not stressed but just needs to get away.   Back when he was on Adderall he was seeing [facility] [address].   HE also wants to know if he can get retested for Chlamydia, he was treated for it, but it was only 3 weeks ago. He is in a monogamous relationship.    Past Medical History  Diagnosis Date  . Allergy    No past surgical history on file. History   Social History  . Marital Status: Single    Spouse Name: N/A    Number of Children: N/A  . Years of Education: N/A   Social History Main Topics  . Smoking  status: Never Smoker   . Smokeless tobacco: None  . Alcohol Use: No  . Drug Use: No  . Sexual Activity: Yes   Other Topics Concern  . None   Social History Narrative  . None   Family History  Problem Relation Age of Onset  . Diabetes Father    No Known Allergies Prior to Admission medications   Medication Sig Start Date End Date Taking? Authorizing Provider  doxycycline (VIBRA-TABS) 100 MG tablet Take 1 tablet (100 mg total) by mouth 2 (two) times daily. 07/09/13   Tonye Pearsonobert P Doolittle, MD     ROS: The patient denies fevers, chills, night sweats, unintentional weight loss, chest pain, palpitations, wheezing, dyspnea on exertion, nausea, vomiting, abdominal pain, dysuria, hematuria, melena, numbness, weakness, or tingling.   All other systems have been reviewed and were otherwise negative with the exception of those mentioned in the HPI and as above.    PHYSICAL EXAM: Filed Vitals:   08/01/13 1502  BP: 135/85  Pulse: 90  Temp: 98.3 F (36.8 C)  Resp: 16   Filed Vitals:   08/01/13 1502  Height: 5' 10.5" (1.791 m)  Weight: 222 lb (100.699 kg)   Body mass index is 31.39 kg/(m^2).  General: Alert, no  acute distress, fustrated, male, slightly withdrawn but very articualte  HEENT:  Normocephalic, atraumatic, oropharynx patent. EOMI, PERRLA Cardiovascular:  Regular rate and rhythm, no rubs murmurs or gallops.  No Carotid bruits, radial pulse intact. No pedal edema.  Respiratory: Clear to auscultation bilaterally.  No wheezes, rales, or rhonchi.  No cyanosis, no use of accessory musculature GI: No organomegaly, abdomen is soft and non-tender, positive bowel sounds.  No masses. Skin: No rashes. Neurologic: Facial musculature symmetric. Psychiatric: Patient is appropriate throughout our interaction. Lymphatic: No cervical lymphadenopathy Musculoskeletal: Gait intact.   LABS: Results for orders placed in visit on 07/07/13  GC/CHLAMYDIA PROBE AMP      Result Value Ref Range     CT Probe RNA POSITIVE (*)    GC Probe RNA NEGATIVE    POCT URINALYSIS DIPSTICK      Result Value Ref Range   Color, UA lt yellow     Clarity, UA clear     Glucose, UA neg     Bilirubin, UA neg     Ketones, UA neg     Spec Grav, UA 1.010     Blood, UA trace-lysed     pH, UA 6.0     Protein, UA neg     Urobilinogen, UA 0.2     Nitrite, UA neg     Leukocytes, UA Negative    POCT UA - MICROSCOPIC ONLY      Result Value Ref Range   WBC, Ur, HPF, POC neg     RBC, urine, microscopic neg     Bacteria, U Microscopic neg     Mucus, UA neg     Epithelial cells, urine per micros neg     Crystals, Ur, HPF, POC neg     Casts, Ur, LPF, POC neg     Yeast, UA neg       EKG/XRAY:   Primary read interpreted by Dr. Conley Rolls at [facility].   ASSESSMENT/PLAN: Encounter Diagnoses  Name Primary?  . Poor concentration Yes  . Mood changes     Albert Larsen is here with a lot of fustration. He does not know why for the last 10 years he has had difficulty focusing and finishing tasks. He is an Dance movement psychotherapist and works for [employer] and his inability to finish his projects and more specifically his procrastination is effecting his work and his relationships with people at work and his wife. He states he was not like this before. He states he is not depressed, just unable to finish tasks and he has ample time to do it but he is not able to do it. 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 He was previously on Adderall according to the patient and that helped but he stopped it because he thought he was fine, however , he thinks he might need to be on it again. I have no records from him about this. I am not sure if his lack of focus is the cause of his irritability or if he has underlying anxiety/depression or bipolar. He denies SI/ HI/hallucinations. I am pretty sure I am not getting the whole story so will refer him to  psychology and psychaitry for evaluation of ADD/ADHD and also mood disorders respectively. He will be started on Strattera and SEs were discussed with him so that he can maintain his job. Thyroid testing 6 months ago was  normal.   Additionally, he wants to get rechecked for Chlamydia which he has had before and recently was + and was treated for it. He is only 3 weeks out. I told him that if he was treated then I would wait for 4 weeks to make sure we will not get a false + since I believe that is how long it takes for the test to ot pick up on a prior treated infection.   Zung anxiety index 58 ( minimal to moderate anxiety) ADHD self reporting score 57  (high likely to have ADHD) Mood Questionnaire  Moderate problem Rx Straterra with 1 RF Refer to psychology for ADD/ADHD testing ( names and numbers given) Refer to pscyiatry for mood changes F/u in 1 month if no psychiatry ppt.   Gross sideeffects, risk and benefits, and alternatives of medications d/w patient. Patient is aware that all medications have potential sideeffects and we are unable to predict every sideeffect or drug-drug interaction that may occur.  Lenell Antuhao P Le, DO 08/01/2013 5:02 PM

## 2013-08-01 NOTE — Patient Instructions (Signed)
Atomoxetine capsules What is this medicine? ATOMOXETINE (AT oh mox e teen) is used to treat attention deficit/hyperactivity disorder, also known as ADHD. It is not a stimulant like other drugs for ADHD. This drug can improve attention span, concentration, and emotional control. It can also reduce restless or overactive behavior. This medicine may be used for other purposes; ask your health care provider or pharmacist if you have questions. COMMON BRAND NAME(S): Strattera What should I tell my health care provider before I take this medicine? They need to know if you have any of these conditions: -glaucoma -high or low blood pressure -history of stroke -irregular heartbeat or other cardiac disease -liver disease -mania or bipolar disorder -pheochromocytoma -suicidal thoughts -an unusual or allergic reaction to atomoxetine, other medicines, foods, dyes, or preservatives -pregnant or trying to get pregnant -breast-feeding How should I use this medicine? Take this medicine by mouth with a glass of water. Follow the directions on the prescription label. You can take it with or without food. If it upsets your stomach, take it with food. If you have difficulty sleeping and you take more than 1 dose per day, take your last dose before 6 PM. Take your medicine at regular intervals. Do not take it more often than directed. Do not stop taking except on your doctor's advice. A special MedGuide will be given to you by the pharmacist with each prescription and refill. Be sure to read this information carefully each time. Talk to your pediatrician regarding the use of this medicine in children. While this drug may be prescribed for children as young as 6 years for selected conditions, precautions do apply. Overdosage: If you think you have taken too much of this medicine contact a poison control center or emergency room at once. NOTE: This medicine is only for you. Do not share this medicine with  others. What if I miss a dose? If you miss a dose, take it as soon as you can. If it is almost time for your next dose, take only that dose. Do not take double or extra doses. What may interact with this medicine? Do not take this medicine with any of the following medications: -MAOIs like Carbex, Eldepryl, Marplan, Nardil, and Parnate -reboxetine This medicine may also interact with the following medications: -albuterol -certain medicines for blood pressure, heart disease, irregular heart beat -certain medicines for depression, anxiety, or psychotic disturbances -cold or allergy medicines -isoproterenol -medicines that increase blood pressure like dopamine, dobutamine, or ephedrine -stimulant medicines for attention disorders, weight loss, or to stay awake -terbutaline This list may not describe all possible interactions. Give your health care provider a list of all the medicines, herbs, non-prescription drugs, or dietary supplements you use. Also tell them if you smoke, drink alcohol, or use illegal drugs. Some items may interact with your medicine. What should I watch for while using this medicine? It may take a week or more for this medicine to take effect. This is why it is very important to continue taking the medicine and not miss any doses. If you have been taking this medicine regularly for some time, do not suddenly stop taking it. Ask your doctor or health care professional for advice. Rarely, this medicine may increase thoughts of suicide or suicide attempts in children and teenagers. Call your child's health care professional right away if your child or teenager has new or increased thoughts of suicide or has changes in mood or behavior like becoming irritable or anxious. Regularly monitor your child   for these behavioral changes. For males, contact you doctor or health care professional right away if you have an erection that lasts longer than 4 hours or if it becomes painful. This  may be a sign of serious problem and must be treated right away to prevent permanent damage. You may get drowsy or dizzy. Do not drive, use machinery, or do anything that needs mental alertness until you know how this medicine affects you. Do not stand or sit up quickly, especially if you are an older patient. This reduces the risk of dizzy or fainting spells. Alcohol can make you more drowsy and dizzy. Avoid alcoholic drinks. Do not treat yourself for coughs, colds or allergies without asking your doctor or health care professional for advice. Some ingredients can increase possible side effects. Your mouth may get dry. Chewing sugarless gum or sucking hard candy, and drinking plenty of water will help. What side effects may I notice from receiving this medicine? Side effects that you should report to your doctor or health care professional as soon as possible: -allergic reactions like skin rash, itching or hives, swelling of the face, lips, or tongue -breathing problems -chest pain -dark urine -fast, irregular heartbeat -general ill feeling or flu-like symptoms -high blood pressure -males: prolonged or painful erection -stomach pain or tenderness -trouble passing urine or change in the amount of urine -vomiting -weight loss -yellowing of the eyes or skin Side effects that usually do not require medical attention (report to your doctor or health care professional if they continue or are bothersome): -change in sex drive or performance -constipation or diarrhea -headache -loss of appetite -menstrual period irregularities -nausea -stomach upset This list may not describe all possible side effects. Call your doctor for medical advice about side effects. You may report side effects to FDA at 1-800-FDA-1088. Where should I keep my medicine? Keep out of the reach of children. Store at room temperature between 15 and 30 degrees C (59 and 86 degrees F). Throw away any unused medication after  the expiration date. NOTE: This sheet is a summary. It may not cover all possible information. If you have questions about this medicine, talk to your doctor, pharmacist, or health care provider.  2014, Elsevier/Gold Standard. (2012-12-18 14:33:10)  

## 2013-11-30 ENCOUNTER — Ambulatory Visit (INDEPENDENT_AMBULATORY_CARE_PROVIDER_SITE_OTHER): Payer: BC Managed Care – PPO | Admitting: Family Medicine

## 2013-11-30 VITALS — BP 138/90 | HR 73 | Temp 98.2°F | Resp 16 | Ht 72.0 in | Wt 217.4 lb

## 2013-11-30 DIAGNOSIS — R369 Urethral discharge, unspecified: Secondary | ICD-10-CM

## 2013-11-30 DIAGNOSIS — R300 Dysuria: Secondary | ICD-10-CM

## 2013-11-30 DIAGNOSIS — Z8619 Personal history of other infectious and parasitic diseases: Secondary | ICD-10-CM

## 2013-11-30 LAB — HIV ANTIBODY (ROUTINE TESTING W REFLEX): HIV 1&2 Ab, 4th Generation: NONREACTIVE

## 2013-11-30 LAB — RPR

## 2013-11-30 MED ORDER — CEFTRIAXONE SODIUM 1 G IJ SOLR
250.0000 mg | Freq: Once | INTRAMUSCULAR | Status: AC
  Administered 2013-11-30: 250 mg via INTRAMUSCULAR

## 2013-11-30 MED ORDER — AZITHROMYCIN 250 MG PO TABS: 1000.0000 mg | ORAL_TABLET | Freq: Once | ORAL | Status: DC

## 2013-11-30 NOTE — Progress Notes (Addendum)
Subjective:    Patient ID: Albert Larsen, male    DOB: 06/30/1980, 33 y.o.   MRN: [id number] This chart was scribed for Albert StaggersJeffrey Jhovanny Guinta, MD by Albert Larsen, ED Scribe. The patient was seen in Room 11. The patient's care was started at 8:47 AM.   11/30/2013   Exposure to STD The patient's primary symptoms include penile discharge and penile pain (burning). Pertinent negatives include no abdominal pain or fever.   Albert Larsen is a 33 y.o. male PCP: GUEST, Albert StaplerHRIS WARREN, MD  Chief Complaint  Patient presents with  . Exposure to STD   Pt reports he has noticed penile discharge onset 2 days ago. Pt reports since March he has not had any discharge. Pt reports some mild burning.  Pt denies new sexual partners. Pt reports he still sexually active with the same partner he had in March. Pt she was treated for Chlamydia. Pt reports they are monogamous. Pt reports he uses condoms at every sexual encounter. Pt denies rash, fever, abdominal pain.   Pt is here for possible exposure to STI of note he did test positive for Chlamydia in March of this year. Treated with Rocephin and Zithromax. He was seen again March 28 with a persistent positive test, treated with Doxycycline for 1 week.   Patient Active Problem List   Diagnosis Date Noted  . IFG (impaired fasting glucose) 01/15/2013  . Hyperthyroidism 01/15/2013   Past Medical History  Diagnosis Date  . Allergy    History reviewed. No pertinent past surgical history. No Known Allergies Prior to Admission medications   Medication Sig Start Date End Date Taking? Authorizing Provider  atomoxetine (STRATTERA) 40 MG capsule Take 1 capsule (40 mg total) by mouth daily. 08/01/13   Albert P Le, DO   History   Social History  . Marital Status: Single    Spouse Name: N/A    Number of Children: N/A  . Years of Education: N/A   Occupational History  . Not on file.   Social History Main Topics  . Smoking status: Never Smoker   .  Smokeless tobacco: Not on file  . Alcohol Use: No  . Drug Use: No  . Sexual Activity: Yes   Other Topics Concern  . Not on file   Social History Narrative  . No narrative on file   Review of Systems  Constitutional: Negative for fever.  Gastrointestinal: Negative for abdominal pain.  Genitourinary: Positive for discharge and penile pain (burning).  All other systems reviewed and are negative.  Objective:  Physical Exam  Nursing note and vitals reviewed. Constitutional: He is oriented to person, place, and time. He appears well-developed and well-nourished. No distress.  HENT:  Head: Normocephalic and atraumatic.  Eyes: Conjunctivae and EOM are normal.  Neck: Neck supple. No tracheal deviation present.  Cardiovascular: Normal rate, regular rhythm and normal heart sounds.   Pulmonary/Chest: Effort normal. No respiratory distress.  Abdominal: Soft. There is no tenderness.  Genitourinary: Penis normal. Right testis shows no tenderness. Left testis shows no tenderness. No penile erythema. No discharge found.  No external rash.   Musculoskeletal: Normal range of motion.  Lymphadenopathy:       Right: No inguinal adenopathy present.       Left: No inguinal adenopathy present.  Neurological: He is alert and oriented to person, place, and time.  Skin: Skin is warm and dry.  Psychiatric: He has a normal mood and affect. His behavior is normal.  Results for orders placed in visit on 07/07/13  GC/CHLAMYDIA PROBE AMP      Result Value Ref Range   CT Probe RNA POSITIVE (*)    GC Probe RNA NEGATIVE    POCT URINALYSIS DIPSTICK      Result Value Ref Range   Color, UA lt yellow     Clarity, UA clear     Glucose, UA neg     Bilirubin, UA neg     Ketones, UA neg     Spec Grav, UA 1.010     Blood, UA trace-lysed     pH, UA 6.0     Protein, UA neg     Urobilinogen, UA 0.2     Nitrite, UA neg     Leukocytes, UA Negative    POCT UA - MICROSCOPIC ONLY      Result Value Ref Range     WBC, Ur, HPF, POC neg     RBC, urine, microscopic neg     Bacteria, U Microscopic neg     Mucus, UA neg     Epithelial cells, urine per micros neg     Crystals, Ur, HPF, POC neg     Casts, Ur, LPF, POC neg     Yeast, UA neg     Filed Vitals:   11/30/13 0824  BP: 138/90  Pulse: 73  Temp: 98.2 F (36.8 C)  Resp: 16  Height: 6' (1.829 m)  Weight: 217 lb 6.4 oz (98.612 kg)  SpO2: 99%    Assessment & Plan:  8:52 AM- Patient informed of current plan for treatment and evaluation and agrees with plan at this time. Albert Larsen is a 33 y.o. male Penile discharge - Plan: HIV antibody, RPR, GC/Chlamydia Probe Amp, cefTRIAXone (ROCEPHIN) injection 250 mg, azithromycin (ZITHROMAX) 250 MG tablet  Dysuria - Plan: HIV antibody, RPR, GC/Chlamydia Probe Amp, cefTRIAXone (ROCEPHIN) injection 250 mg, azithromycin (ZITHROMAX) 250 MG tablet  History of chlamydia - Plan: HIV antibody, RPR, GC/Chlamydia Probe Amp, cefTRIAXone (ROCEPHIN) injection 250 mg, azithromycin (ZITHROMAX) 250 MG tablet  Recurrence of dysuria and d/c.  Had not had test of cure form prior chlamydia.   -rocephin 250mg  and azithromycin 1gram.  -check uriprobe, HIV, RPR - after initial agreement to have these done, declined bloodwork in office - plans to return later today or this week to have this drawn.   -abstinence or strict condom use, and partner to be tested/treated if needed.   -TOC in 1 month.   -rtc precautions.   Meds ordered this encounter  Medications  . cefTRIAXone (ROCEPHIN) injection 250 mg    Sig:     Order Specific Question:  Antibiotic Indication:    Answer:  STD  . azithromycin (ZITHROMAX) 250 MG tablet    Sig: Take 4 tablets (1,000 mg total) by mouth once.    Dispense:  4 tablet    Refill:  0   Patient Instructions  Azithromycin today, and you were given an antibiotic injection. Abstinence or strict condom use until STI results known and partner may also ned to be tested/treated.  If test  positive - will need test of cure in 3-4 weeks.  You should receive a call or letter about your lab results within the next week to 10 days.   Return to the clinic or go to the nearest emergency room if any of your symptoms worsen or new symptoms occur.     I personally performed the services described in this documentation, which  was scribed in my presence. The recorded information has been reviewed and considered, and addended by me as needed.

## 2013-11-30 NOTE — Patient Instructions (Addendum)
Azithromycin today, and you were given an antibiotic injection. Abstinence or strict condom use until STI results known and partner may also ned to be tested/treated.  If test positive - will need test of cure in 3-4 weeks.  You should receive a call or letter about your lab results within the next week to 10 days.  Return as soon as you feel able to have blood work drawn - today would be best.   Return to the clinic or go to the nearest emergency room if any of your symptoms worsen or new symptoms occur.

## 2013-12-01 LAB — GC/CHLAMYDIA PROBE AMP
CT Probe RNA: NEGATIVE
GC Probe RNA: NEGATIVE

## 2013-12-03 ENCOUNTER — Telehealth: Payer: Self-pay

## 2013-12-04 NOTE — Telephone Encounter (Signed)
ERROR

## 2013-12-05 ENCOUNTER — Telehealth: Payer: Self-pay

## 2013-12-05 NOTE — Telephone Encounter (Signed)
Pt advised of results and understands to use proper protection during intercourse.

## 2013-12-05 NOTE — Telephone Encounter (Signed)
Patient requesting lab results. Please call patient at [phone]

## 2014-01-23 ENCOUNTER — Ambulatory Visit (INDEPENDENT_AMBULATORY_CARE_PROVIDER_SITE_OTHER): Payer: BC Managed Care – PPO | Admitting: Family Medicine

## 2014-01-23 VITALS — BP 124/84 | HR 76 | Temp 98.4°F | Resp 16 | Ht 72.5 in | Wt 223.0 lb

## 2014-01-23 DIAGNOSIS — H00013 Hordeolum externum right eye, unspecified eyelid: Secondary | ICD-10-CM

## 2014-01-23 MED ORDER — ERYTHROMYCIN 5 MG/GM OP OINT: 1.0000 "application " | TOPICAL_OINTMENT | Freq: Four times a day (QID) | OPHTHALMIC | Status: DC

## 2014-01-23 NOTE — Patient Instructions (Signed)
It looks like you have a stye, or pimple of the eyelid.  Use the antibiotic ointment as needed, and continue warm compresses several times a day.  You can also wash the area with BABY shampoo.  Let me know if not better in the next couple of days- Sooner if worse.   Sty A sty (hordeolum) is an infection of a gland in the eyelid located at the base of the eyelash. A sty may develop a white or yellow head of pus. It can be puffy (swollen). Usually, the sty will burst and pus will come out on its own. They do not leave lumps in the eyelid once they drain. A sty is often confused with another form of cyst of the eyelid called a chalazion. Chalazions occur within the eyelid and not on the edge where the bases of the eyelashes are. They often are red, sore and then form firm lumps in the eyelid. CAUSES   Germs (bacteria).  Lasting (chronic) eyelid inflammation. SYMPTOMS   Tenderness, redness and swelling along the edge of the eyelid at the base of the eyelashes.  Sometimes, there is a white or yellow head of pus. It may or may not drain. DIAGNOSIS  An ophthalmologist will be able to distinguish between a sty and a chalazion and treat the condition appropriately.  TREATMENT   Styes are typically treated with warm packs (compresses) until drainage occurs.  In rare cases, medicines that kill germs (antibiotics) may be prescribed. These antibiotics may be in the form of drops, cream or pills.  If a hard lump has formed, it is generally necessary to do a small incision and remove the hardened contents of the cyst in a minor surgical procedure done in the office.  In suspicious cases, your caregiver may send the contents of the cyst to the lab to be certain that it is not a rare, but dangerous form of cancer of the glands of the eyelid. HOME CARE INSTRUCTIONS   Wash your hands often and dry them with a clean towel. Avoid touching your eyelid. This may spread the infection to other parts of the  eye.  Apply heat to your eyelid for 10 to 20 minutes, several times a day, to ease pain and help to heal it faster.  Do not squeeze the sty. Allow it to drain on its own. Wash your eyelid carefully 3 to 4 times per day to remove any pus. SEEK IMMEDIATE MEDICAL CARE IF:   Your eye becomes painful or puffy (swollen).  Your vision changes.  Your sty does not drain by itself within 3 days.  Your sty comes back within a short period of time, even with treatment.  You have redness (inflammation) around the eye.  You have a fever. Document Released: 01/06/2005 Document Revised: 06/21/2011 Document Reviewed: 07/13/2013 [facility] Patient Information 2015 [place]. This information is not intended to replace advice given to you by your health care provider. Make sure you discuss any questions you have with your health care provider.

## 2014-01-23 NOTE — Progress Notes (Signed)
[facility] [address], [place] [id number] [phone]  Date:  01/23/2014   Name:  Albert Larsen   DOB:  12/03/1980   MRN:  [id number]  PCP:  Tally DueGUEST, CHRIS WARREN, MD    Chief Complaint: Eye Pain   History of Present Illness:  Albert Larsen is a 33 y.o. very pleasant male patient who presents with the following:  He has noted a problem with his right eye for 4 or 5 days.  He is using compresses. He notes tenderness- mild- in the lower lid which fluctuates.  No discharge or crusting from the eye.   No corrective lenses used.   Vision seems to be ok.   He feels "a little bit uncomfortable with the swollen eye," but otherwise no pain.  No photophobia Otherwise he is generally healthy.    Patient Active Problem List   Diagnosis Date Noted  . IFG (impaired fasting glucose) 01/15/2013  . Hyperthyroidism 01/15/2013    Past Medical History  Diagnosis Date  . Allergy     History reviewed. No pertinent past surgical history.  History  Substance Use Topics  . Smoking status: Never Smoker   . Smokeless tobacco: Not on file  . Alcohol Use: No    Family History  Problem Relation Age of Onset  . Diabetes Father     No Known Allergies  Medication list has been reviewed and updated.  Current Outpatient Prescriptions on File Prior to Visit  Medication Sig Dispense Refill  . atomoxetine (STRATTERA) 40 MG capsule Take 1 capsule (40 mg total) by mouth daily.  30 capsule  1   No current facility-administered medications on file prior to visit.    Review of Systems:  As per HPI- otherwise negative.   Physical Examination: Filed Vitals:   01/23/14 0814  BP: 124/84  Pulse: 76  Temp: 98.4 F (36.9 C)  Resp: 16   Filed Vitals:   01/23/14 0814  Height: 6' 0.5" (1.842 m)  Weight: 223 lb (101.152 kg)   Body mass index is 29.81 kg/(m^2). Ideal Body Weight: Weight in (lb) to have BMI = 25: 186.5  GEN: WDWN, NAD, Non-toxic, A & O  x 3, looks well HEENT: Atraumatic, Normocephalic. Neck supple. No masses, No LAD.  Bilateral TM wnl, oropharynx normal.  PEERL,EOMI.   Normal fundoscopic exam.   Ears and Nose: No external deformity. CV: RRR, No M/G/R. No JVD. No thrill. No extra heart sounds. PULM: CTA B, no wheezes, crackles, rhonchi. No retractions. No resp. distress. No accessory muscle use. EXTR: No c/c/e NEURO Normal gait.  PSYCH: Normally interactive. Conversant. Not depressed or anxious appearing.  Calm demeanor.  Negative fluorescin exam Small stye right lower lid on the medial side.  Appears that it is nearly ready to burst  Assessment and Plan: Stye, right - Plan: erythromycin ophthalmic ointment  Compresses and antibiotic ointment.  See patient instructions for more details.     Signed Abbe AmsterdamJessica Copland, MD

## 2014-02-08 ENCOUNTER — Ambulatory Visit (INDEPENDENT_AMBULATORY_CARE_PROVIDER_SITE_OTHER): Payer: BC Managed Care – PPO | Admitting: Family Medicine

## 2014-02-08 VITALS — BP 116/70 | HR 72 | Temp 98.4°F | Resp 16 | Ht 72.0 in | Wt 222.0 lb

## 2014-02-08 DIAGNOSIS — E0590 Thyrotoxicosis, unspecified without thyrotoxic crisis or storm: Secondary | ICD-10-CM

## 2014-02-08 DIAGNOSIS — Z23 Encounter for immunization: Secondary | ICD-10-CM

## 2014-02-08 DIAGNOSIS — Z0000 Encounter for general adult medical examination without abnormal findings: Secondary | ICD-10-CM

## 2014-02-08 DIAGNOSIS — Z13220 Encounter for screening for lipoid disorders: Secondary | ICD-10-CM

## 2014-02-08 DIAGNOSIS — Z8619 Personal history of other infectious and parasitic diseases: Secondary | ICD-10-CM

## 2014-02-08 DIAGNOSIS — Z131 Encounter for screening for diabetes mellitus: Secondary | ICD-10-CM

## 2014-02-08 LAB — COMPLETE METABOLIC PANEL WITH GFR
ALT: 17 U/L (ref 0–53)
AST: 15 U/L (ref 0–37)
Albumin: 4.4 g/dL (ref 3.5–5.2)
Alkaline Phosphatase: 76 U/L (ref 39–117)
BUN: 10 mg/dL (ref 6–23)
CO2: 30 mEq/L (ref 19–32)
Calcium: 9.2 mg/dL (ref 8.4–10.5)
Chloride: 103 mEq/L (ref 96–112)
Creat: 0.95 mg/dL (ref 0.50–1.35)
GFR, Est African American: >89 mL/min
GFR, Est Non African American: >89 mL/min
Glucose, Bld: 95 mg/dL (ref 70–99)
Potassium: 4.4 mEq/L (ref 3.5–5.3)
Sodium: 141 mEq/L (ref 135–145)
Total Bilirubin: 0.7 mg/dL (ref 0.2–1.2)
Total Protein: 7.2 g/dL (ref 6.0–8.3)

## 2014-02-08 LAB — LIPID PANEL
Cholesterol: 137 mg/dL (ref 0–200)
HDL: 31 mg/dL — ABNORMAL LOW (ref >39)
LDL Cholesterol: 90 mg/dL (ref 0–99)
Total CHOL/HDL Ratio: 4.4 Ratio
Triglycerides: 80 mg/dL (ref <150)
VLDL: 16 mg/dL (ref 0–40)

## 2014-02-08 LAB — HIV ANTIBODY (ROUTINE TESTING W REFLEX): HIV 1&2 Ab, 4th Generation: NONREACTIVE

## 2014-02-08 LAB — TSH: TSH: 0.502 u[IU]/mL (ref 0.350–4.500)

## 2014-02-08 LAB — RPR

## 2014-02-08 NOTE — Patient Instructions (Signed)
You should receive a call or letter about your lab results within the next week to 10 days.  Start exercise and visit with eye care provider as discussed.   Keeping you healthy  Get these tests  Blood pressure- Have your blood pressure checked once a year by your healthcare provider.  Normal blood pressure is 120/80.  Weight- Have your body mass index (BMI) calculated to screen for obesity.  BMI is a measure of body fat based on height and weight. You can also calculate your own BMI at [email or web address]-[email or web address]/.  Cholesterol- Have your cholesterol checked regularly starting at age 34, sooner may be necessary if you have diabetes, high blood pressure, if a family member developed heart diseases at an early age or if you smoke.   Chlamydia, HIV, and other sexual transmitted disease- Get screened each year until the age of 34 then within three months of each new sexual partner.  Diabetes- Have your blood sugar checked regularly if you have high blood pressure, high cholesterol, a family history of diabetes or if you are overweight.  Get these vaccines  Flu shot- Every fall.  Tetanus shot- Every 10 years.  Menactra- Single dose; prevents meningitis.  Take these steps  Don't smoke- If you do smoke, ask your healthcare provider about quitting. For tips on how to quit, go to www.smokefree.gov or call 1-800-QUIT-NOW.  Be physically active- Exercise 5 days a week for at least 30 minutes.  If you are not already physically active start slow and gradually work up to 30 minutes of moderate physical activity.  Examples of moderate activity include walking briskly, mowing the yard, dancing, swimming bicycling, etc.  Eat a healthy diet- Eat a variety of healthy foods such as fruits, vegetables, low fat milk, low fat cheese, yogurt, lean meats, poultry, fish, beans, tofu, etc.  For more information on healthy eating, go to www.thenutritionsource.org  Drink alcohol in moderation- Limit alcohol intake  two drinks or less a day.  Never drink and drive.  Dentist- Brush and floss teeth twice daily; visit your dentis twice a year.  Depression-Your emotional health is as important as your physical health.  If you're feeling down, losing interest in things you normally enjoy please talk with your healthcare provider.  Gun Safety- If you keep a gun in your home, keep it unloaded and with the safety lock on.  Bullets should be stored separately.  Helmet use- Always wear a helmet when riding a motorcycle, bicycle, rollerblading or skateboarding.  Safe sex- If you may be exposed to a sexually transmitted infection, use a condom  Seat belts- Seat bels can save your life; always wear one.  Smoke/Carbon Monoxide detectors- These detectors need to be installed on the appropriate level of your home.  Replace batteries at least once a year.  Skin Cancer- When out in the sun, cover up and use sunscreen SPF 15 or higher.  Violence- If anyone is threatening or hurting you, please tell your healthcare provider.

## 2014-02-08 NOTE — Progress Notes (Addendum)
Subjective:    Patient ID: Albert Larsen, male    DOB: 03/14/1981, 33 y.o.   MRN: [id number]  This chart was scribed for Albert StaggersJeffrey Jaimeson Gopal, MD by Tonye RoyaltyJoshua Chen, ED Scribe. This patient was seen in room 2 and the patient's care was started at 8:54 AM.   Chief Complaint  Patient presents with  . Annual Exam    HPI  HPI Comments: Albert Larsen is a 33 y.o. male who presents [facility] for physical exam. He denies any particular complaints or concerns.  He has a history of hyperthyroidism followed by Dr. Lucianne MussKumar. He reports uptake scan by Dr. Kumar 1 year ago with normal results and did not schedule him for follow up. He states that Dr. Lucianne MussKumar told him his prior abnormal results might have been due to stress as well.  He also has history of ADHD with component of anxiety, see the note from Dr. Dierdre SearlesLi on April 22; he was referred to psychiatry for mood changes and psychology for ADD testing, was prescribedt Straterra 1 month with 1 refill. He states he did not use Medical sales representativetraterra and never saw psychology or psychiatry; he states he was under a lot of stress at that time, but his complaints resolved after taking 2 months off of work to travel. He denies depression, anxiety, and suicidal ideation. He denies ever using alcohol or drugs.  Immunizations:TDAP on September 2014. He states he has not had flu shot yet this year.  Exercise: He states he has not exercised significantly this past year.  Dentist: last seen 1.5 weeks ago, has annother appointment in January  Opthamology: States he was last screened 7-8 years ago.  STI testing: History of chlamydia in March. Negative testing in August. He denies any new sexual partners. Denies any recent discharge.     Patient Active Problem List   Diagnosis Date Noted  . IFG (impaired fasting glucose) 01/15/2013  . Hyperthyroidism 01/15/2013   Past Medical History  Diagnosis Date  . Allergy    History reviewed. No  pertinent past surgical history. No Known Allergies Prior to Admission medications   Medication Sig Start Date End Date Taking? Authorizing Provider  atomoxetine (STRATTERA) 40 MG capsule Take 1 capsule (40 mg total) by mouth daily. 08/01/13   Thao P Le, DO  erythromycin ophthalmic ointment Place 1 application into the right eye 4 (four) times daily. Use as needed for stye 01/23/14   Pearline CablesJessica C Copland, MD   History   Social History  . Marital Status: Single    Spouse Name: N/A    Number of Children: N/A  . Years of Education: N/A   Occupational History  . Not on file.   Social History Main Topics  . Smoking status: Never Smoker   . Smokeless tobacco: Not on file  . Alcohol Use: No  . Drug Use: No  . Sexual Activity: Yes   Other Topics Concern  . Not on file   Social History Narrative  . No narrative on file    Review of Systems  Eyes: Negative for visual disturbance.  Gastrointestinal: Negative for abdominal pain.  Genitourinary: Negative for discharge.  Psychiatric/Behavioral: Negative for suicidal ideas and dysphoric mood. The patient is not nervous/anxious.   13 point review of systems per patient health survey noted.  Negative other than as indicated above or in A/P.      Objective:   Physical Exam  Nursing note and vitals reviewed. Constitutional: He  is oriented to person, place, and time. He appears well-developed and well-nourished.  HENT:  Head: Normocephalic and atraumatic.  Right Ear: External ear normal.  Left Ear: External ear normal.  Mouth/Throat: Oropharynx is clear and moist.  Right ear has small amount of cerumen but not obstructive  Eyes: Conjunctivae and EOM are normal. Pupils are equal, round, and reactive to light.  Neck: Normal range of motion. Neck supple. No thyromegaly present.  Cardiovascular: Normal rate, regular rhythm, normal heart sounds and intact distal pulses.   Pulmonary/Chest: Effort normal and breath sounds normal. No respiratory  distress. He has no wheezes.  Abdominal: Soft. He exhibits no distension. There is no tenderness. Hernia confirmed negative in the right inguinal area and confirmed negative in the left inguinal area.  Genitourinary: Penis normal. Right testis shows no tenderness. Left testis shows no tenderness. No discharge found.  Musculoskeletal: Normal range of motion. He exhibits no edema and no tenderness.  Lymphadenopathy:    He has no cervical adenopathy.  Neurological: He is alert and oriented to person, place, and time. He has normal reflexes.  Skin: Skin is warm and dry.  Psychiatric: He has a normal mood and affect. His behavior is normal.    Filed Vitals:   02/08/14 0818  BP: 116/70  Pulse: 72  Temp: 98.4 F (36.9 C)  TempSrc: Oral  Resp: 16  Height: 6' (1.829 m)  Weight: 222 lb (100.699 kg)  SpO2: 98%     Visual Acuity Screening   Right eye Left eye Both eyes  Without correction: 20/13-1 20/13 20/13-1  With correction:          Assessment & Plan:   Albert Larsen is a 33 y.o. male Annual physical exam  - -anticipatory guidance as below in AVS, screening labs above. Health maintenance items as above in HPI discussed/recommended as applicable.   Hyperthyroidism - Plan: TSH   History of chlamydia infection - Plan: HIV antibody, RPR, GC/Chlamydia Probe Amp, asx now.   Screening for hyperlipidemia - Plan: Lipid panel  Screening for diabetes mellitus - Plan: COMPLETE METABOLIC PANEL WITH GFR  Need for prophylactic vaccination and inoculation against influenza - Plan: Flu Vaccine QUAD 36+ mos IM  Sweating for 10 years at night -no new sx's - check tsh - RTC to discuss further  Sneezing - otc claritin as needed.    No orders of the defined types were placed in this encounter.   Patient Instructions  You should receive a call or letter about your lab results within the next week to 10 days.  Start exercise and visit with eye care provider as discussed.   Keeping  you healthy  Get these tests  Blood pressure- Have your blood pressure checked once a year by your healthcare provider.  Normal blood pressure is 120/80.  Weight- Have your body mass index (BMI) calculated to screen for obesity.  BMI is a measure of body fat based on height and weight. You can also calculate your own BMI at https://www.west-esparza.com/www.nhlbisupport.com/bmi/.  Cholesterol- Have your cholesterol checked regularly starting at age 34, sooner may be necessary if you have diabetes, high blood pressure, if a family member developed heart diseases at an early age or if you smoke.   Chlamydia, HIV, and other sexual transmitted disease- Get screened each year until the age of 34 then within three months of each new sexual partner.  Diabetes- Have your blood sugar checked regularly if you have high blood pressure, high cholesterol, a family  history of diabetes or if you are overweight.  Get these vaccines  Flu shot- Every fall.  Tetanus shot- Every 10 years.  Menactra- Single dose; prevents meningitis.  Take these steps  Don't smoke- If you do smoke, ask your healthcare provider about quitting. For tips on how to quit, go to www.smokefree.gov or call 1-800-QUIT-NOW.  Be physically active- Exercise 5 days a week for at least 30 minutes.  If you are not already physically active start slow and gradually work up to 30 minutes of moderate physical activity.  Examples of moderate activity include walking briskly, mowing the yard, dancing, swimming bicycling, etc.  Eat a healthy diet- Eat a variety of healthy foods such as fruits, vegetables, low fat milk, low fat cheese, yogurt, lean meats, poultry, fish, beans, tofu, etc.  For more information on healthy eating, go to www.thenutritionsource.org  Drink alcohol in moderation- Limit alcohol intake two drinks or less a day.  Never drink and drive.  Dentist- Brush and floss teeth twice daily; visit your dentis twice a year.  Depression-Your emotional health is  as important as your physical health.  If you're feeling down, losing interest in things you normally enjoy please talk with your healthcare provider.  Gun Safety- If you keep a gun in your home, keep it unloaded and with the safety lock on.  Bullets should be stored separately.  Helmet use- Always wear a helmet when riding a motorcycle, bicycle, rollerblading or skateboarding.  Safe sex- If you may be exposed to a sexually transmitted infection, use a condom  Seat belts- Seat bels can save your life; always wear one.  Smoke/Carbon Monoxide detectors- These detectors need to be installed on the appropriate level of your home.  Replace batteries at least once a year.  Skin Cancer- When out in the sun, cover up and use sunscreen SPF 15 or higher.  Violence- If anyone is threatening or hurting you, please tell your healthcare provider.     I personally performed the services described in this documentation, which was scribed in my presence. The recorded information has been reviewed and considered, and addended by me as needed.

## 2014-02-09 LAB — GC/CHLAMYDIA PROBE AMP
CT Probe RNA: NEGATIVE
GC Probe RNA: NEGATIVE

## 2014-03-16 ENCOUNTER — Ambulatory Visit (INDEPENDENT_AMBULATORY_CARE_PROVIDER_SITE_OTHER): Payer: BC Managed Care – PPO | Admitting: Emergency Medicine

## 2014-03-16 VITALS — BP 110/68 | HR 72 | Temp 98.4°F | Resp 18 | Ht 72.0 in | Wt 222.8 lb

## 2014-03-16 DIAGNOSIS — R369 Urethral discharge, unspecified: Secondary | ICD-10-CM

## 2014-03-16 NOTE — Progress Notes (Signed)
[facility] [address], [place] [address] [phone]  Date:  03/16/2014   Name:  Albert KatzBachir Laoual Stonewall   DOB:  09/03/1980   MRN:  [id number]  PCP:  Tally DueGUEST, CHRIS WARREN, MD    Chief Complaint: Follow-up and Penile Discharge   History of Present Illness:  [name] [name] is a 33 y.o. very pleasant male patient who presents with the following:  Treated in March for CT and had a follow up test in October.  Has been abstinent for past 8 months. Is concerned that he can express a scant discharge with manipulation.  Does not stain clothing  Has no dysuria, urgency or frequency. Very concerned he may have a residual CT infection Denies other complaint or health concern today.   Patient Active Problem List   Diagnosis Date Noted  . IFG (impaired fasting glucose) 01/15/2013  . Hyperthyroidism 01/15/2013    Past Medical History  Diagnosis Date  . Allergy     History reviewed. No pertinent past surgical history.  History  Substance Use Topics  . Smoking status: Never Smoker   . Smokeless tobacco: Not on file  . Alcohol Use: No    Family History  Problem Relation Age of Onset  . Diabetes Father     No Known Allergies  Medication list has been reviewed and updated.  Current Outpatient Prescriptions on File Prior to Visit  Medication Sig Dispense Refill  . atomoxetine (STRATTERA) 40 MG capsule Take 1 capsule (40 mg total) by mouth daily. (Patient not taking: Reported on 03/16/2014) 30 capsule 1  . erythromycin ophthalmic ointment Place 1 application into the right eye 4 (four) times daily. Use as needed for stye (Patient not taking: Reported on 03/16/2014) 3.5 g 0   No current facility-administered medications on file prior to visit.    Review of Systems:  As per HPI, otherwise negative.    Physical Examination: Filed Vitals:   03/16/14 1018  BP: 110/68  Pulse: 72  Temp: 98.4 F (36.9 C)  Resp: 18   Filed Vitals:   03/16/14 1018   Height: 6' (1.829 m)  Weight: 222 lb 12.8 oz (101.061 kg)   Body mass index is 30.21 kg/(m^2). Ideal Body Weight: Weight in (lb) to have BMI = 25: 183.9   GEN: WDWN, NAD, Non-toxic, Alert & Oriented x 3 HEENT: Atraumatic, Normocephalic.  Ears and Nose: No external deformity. EXTR: No clubbing/cyanosis/edema NEURO: Normal gait.  PSYCH: Normally interactive. Conversant. Not depressed or anxious appearing.  Calm demeanor.  Genitalia normal male  Assessment and Plan: Urethral discharge Glenford PeersUri probe  Signed,  Phillips OdorJeffery Anderson, MD

## 2014-03-18 LAB — HSV(HERPES SIMPLEX VRS) I + II AB-IGG
HSV 1 Glycoprotein G Ab, IgG: 11.96 IV — ABNORMAL HIGH
HSV 2 Glycoprotein G Ab, IgG: <0.1 IV

## 2014-03-22 LAB — GC/CHLAMYDIA PROBE AMP
CT Probe RNA: NEGATIVE
GC Probe RNA: NEGATIVE

## 2014-04-23 ENCOUNTER — Ambulatory Visit (INDEPENDENT_AMBULATORY_CARE_PROVIDER_SITE_OTHER): Payer: BLUE CROSS/BLUE SHIELD | Admitting: Family Medicine

## 2014-04-23 VITALS — BP 118/80 | HR 78 | Temp 99.3°F | Resp 18 | Ht 73.0 in | Wt 224.0 lb

## 2014-04-23 DIAGNOSIS — H44009 Unspecified purulent endophthalmitis, unspecified eye: Secondary | ICD-10-CM

## 2014-04-23 DIAGNOSIS — H00013 Hordeolum externum right eye, unspecified eyelid: Secondary | ICD-10-CM

## 2014-04-23 DIAGNOSIS — H579 Unspecified disorder of eye and adnexa: Secondary | ICD-10-CM

## 2014-04-23 MED ORDER — OFLOXACIN 0.3 % OP SOLN: 2.0000 [drp] | Freq: Four times a day (QID) | OPHTHALMIC | Status: DC

## 2014-04-23 NOTE — Progress Notes (Signed)
 Chief Complaint:  Chief Complaint  Patient presents with  . Eye Problem    since yesterday discharge   . Blurred Vision    HPI: Albert Larsen is a 34 y.o. male who is here for right stye with infection, he has some blurred vision from dc form ey that is green, he trie the erythromycinointment he had prior but did not help, he has tried warm compresses and that was inproved. He has ahd no eye pain or head ache or rashes or fevers.   Past Medical History  Diagnosis Date  . Allergy    History reviewed. No pertinent past surgical history. History   Social History  . Marital Status: Single    Spouse Name: N/A    Number of Children: N/A  . Years of Education: N/A   Social History Main Topics  . Smoking status: Never Smoker   . Smokeless tobacco: None  . Alcohol Use: No  . Drug Use: No  . Sexual Activity: Yes   Other Topics Concern  . None   Social History Narrative   Family History  Problem Relation Age of Onset  . Diabetes Father    No Known Allergies Prior to Admission medications   Medication Sig Start Date End Date Taking? Authorizing Provider  erythromycin ophthalmic ointment Place 1 application into the right eye 4 (four) times daily. Use as needed for stye 01/23/14  Yes Gwenlyn FoundJessica C Copland, MD  atomoxetine (STRATTERA) 40 MG capsule Take 1 capsule (40 mg total) by mouth daily. Patient not taking: Reported on 03/16/2014 08/01/13    P , DO     ROS: The patient denies fevers, chills, night sweats, unintentional weight loss, chest pain, palpitations, wheezing, dyspnea on exertion, nausea, vomiting, abdominal pain, dysuria, hematuria, melena, numbness, weakness, or tingling.   All other systems have been reviewed and were otherwise negative with the exception of those mentioned in the HPI and as above.    PHYSICAL EXAM: Filed Vitals:   04/23/14 1319  BP: 118/80  Pulse: 78  Temp: 99.3 F (37.4 C)  Resp: 18   Filed Vitals:   04/23/14 1319    Height: 6\' 1"  (1.854 m)  Weight: 224 lb (101.606 kg)   Body mass index is 29.56 kg/(m^2).  General: Alert, no acute distress HEENT:  Normocephalic, atraumatic, oropharynx patent. EOMI, PERRLA, fundo exam normal + right eye stye , burst, there is purulent dc, there is swelling in the upper eyelid near the nasalbridge.  Cardiovascular:  Regular rate and rhythm, no rubs murmurs or gallops.  Radial pulse intact. No pedal edema.  Respiratory: Clear to auscultation bilaterally.  No wheezes, rales, or rhonchi.  No cyanosis, no use of accessory musculature GI: No organomegaly, abdomen is soft and non-tender, positive bowel sounds.  No masses. Skin: No rashes. Neurologic: Facial musculature symmetric. Psychiatric: Patient is appropriate throughout our interaction. Lymphatic: No cervical lymphadenopathy Musculoskeletal: Gait intact.   LABS: Results for orders placed or performed in visit on 03/16/14  GC/Chlamydia Probe Amp  Result Value Ref Range   CT Probe RNA NEGATIVE    GC Probe RNA NEGATIVE   HSV(herpes simplex vrs) 1+2 ab-IgG  Result Value Ref Range   HSV 1 Glycoprotein G Ab, IgG 11.96 (H) IV   HSV 2 Glycoprotein G Ab, IgG <0.10 IV     EKG/XRAY:   [facility]   ASSESSMENT/PLAN: Encounter Diagnoses  Name Primary?  . Stye, right Yes  .  Infection of eye region    This is a stye that has burst and there is pustular dc on the inner aspect of the  Right upper lid.  Flushed eye with saline, vision exam grossly normal  Right eye rx ocuflox Precautions given, cont with warm compresses sicne it was doing better with warm compresses per patient  F/u prn    Gross sideeffects, risk and benefits, and alternatives of medications d/w patient. Patient is aware that all medications have potential sideeffects and we are unable to predict every sideeffect or drug-drug interaction that may occur.  ,  PHUONG, DO 04/23/2014 1:44 PM

## 2014-04-25 ENCOUNTER — Other Ambulatory Visit: Payer: Self-pay | Admitting: Family Medicine

## 2014-04-25 ENCOUNTER — Encounter: Payer: Self-pay | Admitting: Family Medicine

## 2014-04-25 DIAGNOSIS — H109 Unspecified conjunctivitis: Secondary | ICD-10-CM

## 2014-04-25 MED ORDER — DOXYCYCLINE HYCLATE 100 MG PO TABS: 100.0000 mg | ORAL_TABLET | Freq: Two times a day (BID) | ORAL | Status: DC

## 2014-04-27 ENCOUNTER — Ambulatory Visit (INDEPENDENT_AMBULATORY_CARE_PROVIDER_SITE_OTHER): Payer: BLUE CROSS/BLUE SHIELD | Admitting: Family Medicine

## 2014-04-27 VITALS — BP 120/80 | HR 74 | Temp 98.4°F | Resp 16 | Ht 73.0 in | Wt 222.0 lb

## 2014-04-27 DIAGNOSIS — H109 Unspecified conjunctivitis: Secondary | ICD-10-CM

## 2014-04-27 MED ORDER — TOBRAMYCIN 0.3 % OP SOLN: 1.0000 [drp] | Freq: Four times a day (QID) | OPHTHALMIC | Status: DC

## 2014-04-27 NOTE — Progress Notes (Signed)
° °  Subjective:    Patient ID: Albert Larsen, male    DOB: 08/16/1980, 34 y.o.   MRN: [id number] This chart was scribed for Elvina SidleKurt Lauenstein, MD by Jolene Provostobert Halas, Medical Scribe. This patient was seen in Room 10 and the patient's care was started a 9:51 AM.  Chief Complaint  Patient presents with   Stye    right eye not better    HPI HPI Comments: Albert Larsen is a 34 y.o. male who [facility] of a persistent stye in the right eye that started five days ago. [facility] for the same sx and was prescribed oxiflux without relief. Pt endorses associated mild pain, redness and discharge from right eye. Pt denies visual disturbance.  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  Has followed Dr. Irwin BrakemanLe's instructions regarding hygiene and meds.  Review of Systems  Constitutional: Negative for fever and chills.  Eyes: Positive for pain, discharge and redness. Negative for visual disturbance.       No foreign body sensation in the eye.  Neurological: Negative for facial asymmetry and headaches.       Objective:   Physical Exam  Constitutional: He is oriented to person, place, and time. He appears well-developed and well-nourished.  HENT:  Head: Normocephalic and atraumatic.  Eyes: Pupils are equal, round, and reactive to light.  Neck: Neck supple.  Cardiovascular: Normal rate and regular rhythm.   Pulmonary/Chest: Effort normal and breath sounds normal. No respiratory distress.  Neurological: He is alert and oriented to person, place, and time.  Skin: Skin is warm and dry.  Psychiatric: He has a normal mood and affect. His behavior is normal.  Nursing note and vitals reviewed.  Slight swelling of the right upper lid, without nodularity. With some crusting exudates along the lid margin.  Right conjunctiva mildly injected     Assessment & Plan:   This chart was scribed in my presence and reviewed by me  personally.    ICD-9-CM ICD-10-CM   1. Conjunctivitis of right eye 372.30 H10.9 tobramycin (TOBREX) 0.3 % ophthalmic solution     Signed, Elvina SidleKurt Lauenstein, MD

## 2014-06-26 ENCOUNTER — Ambulatory Visit (INDEPENDENT_AMBULATORY_CARE_PROVIDER_SITE_OTHER): Payer: BLUE CROSS/BLUE SHIELD | Admitting: Emergency Medicine

## 2014-06-26 VITALS — BP 126/64 | HR 89 | Temp 98.0°F | Resp 19 | Ht 72.5 in | Wt 226.4 lb

## 2014-06-26 DIAGNOSIS — L739 Follicular disorder, unspecified: Secondary | ICD-10-CM | POA: Diagnosis not present

## 2014-06-26 MED ORDER — SULFAMETHOXAZOLE-TRIMETHOPRIM 800-160 MG PO TABS: 1.0000 | ORAL_TABLET | Freq: Two times a day (BID) | ORAL | Status: DC

## 2014-06-26 NOTE — Progress Notes (Signed)
[facility] [address], [place] [address] [phone]  Date:  06/26/2014   Name:  Albert Larsen   DOB:  11/06/1980   MRN:  [id number]  PCP:  Tally DueGUEST, CHRIS WARREN, MD    Chief Complaint: Pruritis and Headache   History of Present Illness:  Albert Larsen is a 34 y.o. very pleasant male patient who presents with the following:  Painful tender mass on left crown of head.  Recurs following haircuts No fever or chills  no drainage No history of injury or prior infection No improvement with over the counter medications or other home remedies. Denies other complaint or health concern today.    Patient Active Problem List   Diagnosis Date Noted  . IFG (impaired fasting glucose) 01/15/2013  . Hyperthyroidism 01/15/2013    Past Medical History  Diagnosis Date  . Allergy     History reviewed. No pertinent past surgical history.  History  Substance Use Topics  . Smoking status: Never Smoker   . Smokeless tobacco: Not on file  . Alcohol Use: No    Family History  Problem Relation Age of Onset  . Diabetes Father     No Known Allergies  Medication list has been reviewed and updated.  Current Outpatient Prescriptions on File Prior to Visit  Medication Sig Dispense Refill  . doxycycline (VIBRA-TABS) 100 MG tablet Take 1 tablet (100 mg total) by mouth 2 (two) times daily. (Patient not taking: Reported on 06/26/2014) 20 tablet 0  . tobramycin (TOBREX) 0.3 % ophthalmic solution Place 1 drop into the right eye every 6 (six) hours. (Patient not taking: Reported on 06/26/2014) 5 mL 0   No current facility-administered medications on file prior to visit.    Review of Systems:  As per HPI, otherwise negative.    Physical Examination: Filed Vitals:   06/26/14 1725  BP: 126/64  Pulse: 89  Temp: 98 F (36.7 C)  Resp: 19   Filed Vitals:   06/26/14 1725  Height: 6' 0.5" (1.842 m)  Weight: 226 lb 6.4 oz (102.694 kg)   Body mass  index is 30.27 kg/(m^2). Ideal Body Weight: Weight in (lb) to have BMI = 25: 186.5   GEN: WDWN, NAD, Non-toxic, Alert & Oriented x 3 HEENT: Atraumatic, Normocephalic.  Ears and Nose: No external deformity. EXTR: No clubbing/cyanosis/edema NEURO: Normal gait.  PSYCH: Normally interactive. Conversant. Not depressed or anxious appearing.  Calm demeanor.  SCALP:  Folliculitis   Assessment and Plan: Folliculitis Septra  Signed,  Phillips OdorJeffery Anderson, MD

## 2014-06-26 NOTE — Patient Instructions (Signed)

## 2014-07-29 ENCOUNTER — Ambulatory Visit (INDEPENDENT_AMBULATORY_CARE_PROVIDER_SITE_OTHER): Payer: BLUE CROSS/BLUE SHIELD | Admitting: Physician Assistant

## 2014-07-29 VITALS — BP 132/80 | HR 91 | Temp 98.6°F | Resp 20 | Ht 72.5 in | Wt 215.4 lb

## 2014-07-29 DIAGNOSIS — R059 Cough, unspecified: Secondary | ICD-10-CM

## 2014-07-29 DIAGNOSIS — R05 Cough: Secondary | ICD-10-CM | POA: Diagnosis not present

## 2014-07-29 MED ORDER — BENZONATATE 100 MG PO CAPS: 100.0000 mg | ORAL_CAPSULE | Freq: Three times a day (TID) | ORAL | Status: DC | PRN

## 2014-07-29 NOTE — Patient Instructions (Signed)
Get plenty of rest and drink at least 64 ounces of water daily. 

## 2014-07-29 NOTE — Progress Notes (Signed)
Subjective:    Patient ID: Albert Larsen, male    DOB: [birth year], 34 y.o.   MRN: [id number]  HPI  Albert Larsen is a pleasant 34 year old male who presents today for evaluation of a cough x 3 days.  Cough began on Friday evening and has worsened today. His chest now hurts when he coughs, but he denies chest pain when he is not coughing. He feels that this cough is deeper in his chest and not just his throat. He has not had a productive cough, and denies fever, chills, shortness of breath, nausea, vomiting, diarrhea, constipation. The cough seems to wake him up in the middle of night, but he is able to drink some water and go back to sleep. The cough does not significantly interfere with his day. He started taking Robitussin and Zyrtec D on Saturday without relief.   He does have seasonal allergies, but feels that this is different from his allergy symptoms. Does not have congestion, rhinorrhea, itchy eyes, or sore throat. He takes Flonase in the morning if he is sneezing or when he notices his allergies beginning, and has not had any problems.  His 4 month old son was sick three weeks ago with bronchiolitis. He denies any other sick contacts or recent personal illnesses.   He notes an appetite change, but feels that this might be due to "eating the same type of food," and that he might "just be bored" with what he has been eating recently. He denies any unexpected weight changes.  Review of Systems  Constitutional: Positive for appetite change. Negative for fever, chills, diaphoresis, fatigue and unexpected weight change.  HENT: Positive for sinus pressure. Negative for congestion, ear pain, postnasal drip, rhinorrhea, sneezing, sore throat and trouble swallowing.   Eyes: Negative for pain and itching.  Respiratory: Positive for cough (Dry cough). Negative for chest tightness and shortness of breath.   Cardiovascular: Negative for chest pain and palpitations.  Gastrointestinal:  Negative for nausea, vomiting, abdominal pain, diarrhea and constipation.  Genitourinary: Negative for dysuria and frequency.  Neurological: Negative for dizziness, light-headedness and headaches.       Objective:   Physical Exam  Constitutional: He is oriented to person, place, and time. He appears well-developed and well-nourished. No distress.  BP 132/80 mmHg  Pulse 91  Temp(Src) 98.6 F (37 C) (Oral)  Resp 20  Ht 6' 0.5" (1.842 m)  Wt 215 lb 6 oz (97.693 kg)  BMI 28.79 kg/m2  SpO2 97%  HENT:  Head: Normocephalic and atraumatic.  Right Ear: External ear and ear canal normal.  Left Ear: External ear and ear canal normal.  Nose: Nose normal.  Mouth/Throat: Uvula is midline and oropharynx is clear and moist. No oropharyngeal exudate, posterior oropharyngeal edema or posterior oropharyngeal erythema.  Cerumen impaction in right ear.  Eyes: Conjunctivae are normal. No scleral icterus.  Neck: Neck supple.  Cardiovascular: Normal rate, regular rhythm and normal heart sounds.  Exam reveals no gallop and no friction rub.   No murmur heard. Pulmonary/Chest: Effort normal and breath sounds normal. He has no wheezes. He has no rhonchi. He has no rales.  Lungs clear to auscultation bilaterally.  Lymphadenopathy:       Head (right side): No submental, no submandibular, no tonsillar, no preauricular, no posterior auricular and no occipital adenopathy present.       Head (left side): No submental, no submandibular, no tonsillar, no preauricular, no posterior auricular and no occipital adenopathy present.  He has no cervical adenopathy.       Right cervical: No superficial cervical, no deep cervical and no posterior cervical adenopathy present.      Left cervical: No superficial cervical, no deep cervical and no posterior cervical adenopathy present.  Neurological: He is alert and oriented to person, place, and time.  Skin: Skin is warm and dry.  Psychiatric: He has a normal mood and  affect.       Assessment & Plan:  1. Cough Advised patient to get plenty of rest and drink a lot of fluid. His cough should resolve in 3-4 days. Return to clinic if symptoms worsen or do not gradually improve.  - benzonatate (TESSALON) 100 MG capsule; Take 1-2 capsules (100-200 mg total) by mouth 3 (three) times daily as needed for cough.  Dispense: 40 capsule; Refill: 0

## 2014-07-30 NOTE — Progress Notes (Signed)
Patient ID: Albert Larsen, male    DOB: 06/21/1980, 34 y.o.   MRN: [id number]  PCP: Tally DueGUEST, CHRIS WARREN, MD  Subjective:   Chief Complaint  Patient presents with  . Cough    Dry    HPI Presents for evaluation of a cough x 3 days.  Cough began on Friday (07/26/2014) evening and has worsened today. His chest now hurts when he coughs, but he denies chest pain when he is not coughing. He feels that this cough is deeper in his chest and not just his throat. He has not had a productive cough, and denies fever, chills, shortness of breath, nausea, vomiting, diarrhea, constipation. The cough seems to wake him up in the middle of night, but he is able to drink some water and go back to sleep. The cough does not significantly interfere with his day. He started taking Robitussin and Zyrtec D on Saturday without relief.   He does have seasonal allergies, but feels that this is different from his allergy symptoms. Does not have congestion, rhinorrhea, itchy eyes, or sore throat. He takes Flonase in the morning if he is sneezing or when he notices his allergies beginning, and has not had any problems.  His 7811 month old son was sick three weeks ago with bronchiolitis. He denies any other sick contacts or recent personal illnesses.   He notes an appetite change, but feels that this might be due to "eating the same type of food," and that he might "just be bored" with what he has been eating recently. He denies any unexpected weight changes.  Review of Systems Constitutional: Positive for appetite change. Negative for fever, chills, diaphoresis, fatigue and unexpected weight change.  HENT: Positive for sinus pressure. Negative for congestion, ear pain, postnasal drip, rhinorrhea, sneezing, sore throat and trouble swallowing.  Eyes: Negative for pain and itching.  Respiratory: Positive for cough (Dry cough). Negative for chest tightness and shortness of breath.  Cardiovascular: Negative for chest  pain and palpitations.  Gastrointestinal: Negative for nausea, vomiting, abdominal pain, diarrhea and constipation.  Genitourinary: Negative for dysuria and frequency.  Neurological: Negative for dizziness, light-headedness and headaches.    Patient Active Problem List   Diagnosis Date Noted  . IFG (impaired fasting glucose) 01/15/2013  . Hyperthyroidism 01/15/2013     Prior to Admission medications   Medication Sig Start Date End Date Taking? Authorizing Provider  Flonase PRN   No Known Allergies     Objective:  Physical Exam  Constitutional: He is oriented to person, place, and time. Vital signs are normal. He appears well-developed and well-nourished. He is active and cooperative. No distress.  BP 132/80 mmHg  Pulse 91  Temp(Src) 98.6 F (37 C) (Oral)  Resp 20  Ht 6' 0.5" (1.842 m)  Wt 215 lb 6 oz (97.693 kg)  BMI 28.79 kg/m2  SpO2 97%  HENT:  Head: Normocephalic and atraumatic.  Right Ear: Hearing, tympanic membrane, external ear and ear canal normal.  Left Ear: Hearing, tympanic membrane, external ear and ear canal normal.  Nose: Nose normal.  Mouth/Throat: Uvula is midline, oropharynx is clear and moist and mucous membranes are normal. Normal dentition.  Eyes: Conjunctivae, EOM and lids are normal. Pupils are equal, round, and reactive to light. No scleral icterus.  Neck: Normal range of motion and phonation normal. Neck supple. No thyromegaly present.  Cardiovascular: Normal rate, regular rhythm and normal heart sounds.   Pulses:      Radial pulses are 2+ on  the right side, and 2+ on the left side.  Pulmonary/Chest: Effort normal and breath sounds normal. No respiratory distress.  Lymphadenopathy:       Head (right side): No tonsillar, no preauricular, no posterior auricular and no occipital adenopathy present.       Head (left side): No tonsillar, no preauricular, no posterior auricular and no occipital adenopathy present.    He has no cervical adenopathy.        Right: No supraclavicular adenopathy present.       Left: No supraclavicular adenopathy present.  Neurological: He is alert and oriented to person, place, and time. No sensory deficit.  Skin: Skin is warm, dry and intact. No rash noted. No cyanosis or erythema. Nails show no clubbing.  Psychiatric: He has a normal mood and affect. His speech is normal and behavior is normal.           Assessment & Plan:   1. Cough Viral illness vs allergic symptoms. Supportive care. If he develops new symptoms, he'll let us know. Otherwise, anticipate resolution over the next 4-5 days. - benzonatate (TESSALON) 100 MG capsule; Take 1-2 capsules (100-200 mg total) by mouth 3 (three) times daily as needed for cough.  Dispense: 40 capsule; Refill: 0   Fernande Bras, PA-C Physician Assistant-Certified [facility] .

## 2014-07-30 NOTE — Progress Notes (Signed)
  Medical screening examination/treatment/procedure(s) were performed by non-physician practitioner and as supervising physician I was immediately available for consultation/collaboration.     

## 2014-08-07 ENCOUNTER — Ambulatory Visit (INDEPENDENT_AMBULATORY_CARE_PROVIDER_SITE_OTHER): Payer: BLUE CROSS/BLUE SHIELD | Admitting: Family Medicine

## 2014-08-07 ENCOUNTER — Ambulatory Visit (INDEPENDENT_AMBULATORY_CARE_PROVIDER_SITE_OTHER): Payer: BLUE CROSS/BLUE SHIELD

## 2014-08-07 VITALS — BP 128/90 | HR 87 | Temp 98.2°F | Ht 60.75 in | Wt 214.5 lb

## 2014-08-07 DIAGNOSIS — R918 Other nonspecific abnormal finding of lung field: Secondary | ICD-10-CM

## 2014-08-07 DIAGNOSIS — J189 Pneumonia, unspecified organism: Secondary | ICD-10-CM

## 2014-08-07 DIAGNOSIS — R05 Cough: Secondary | ICD-10-CM | POA: Diagnosis not present

## 2014-08-07 DIAGNOSIS — J181 Lobar pneumonia, unspecified organism: Secondary | ICD-10-CM

## 2014-08-07 DIAGNOSIS — R059 Cough, unspecified: Secondary | ICD-10-CM

## 2014-08-07 MED ORDER — HYDROCODONE-HOMATROPINE 5-1.5 MG/5ML PO SYRP: ORAL_SOLUTION | ORAL | Status: DC

## 2014-08-07 MED ORDER — AZITHROMYCIN 250 MG PO TABS: ORAL_TABLET | ORAL | Status: DC

## 2014-08-07 NOTE — Patient Instructions (Addendum)
Continue mucinex during the day, hydrocodone cough syrup if needed at night for the cough. Start the antibiotic as discussed. Advil or Alleve if needed for soreness with cough.  If cough not improving in next 3-4 days - recheck. Sooner if fevers or shortness of breath.   Return to the clinic or go to the nearest emergency room if any of your symptoms worsen or new symptoms occur.  Pneumonia Pneumonia is an infection of the lungs.  CAUSES Pneumonia may be caused by bacteria or a virus. Usually, these infections are caused by breathing infectious particles into the lungs (respiratory tract). SIGNS AND SYMPTOMS   Cough.  Fever.  Chest pain.  Increased rate of breathing.  Wheezing.  Mucus production. DIAGNOSIS  If you have the common symptoms of pneumonia, your health care provider will typically confirm the diagnosis with a chest X-ray. The X-ray will show an abnormality in the lung (pulmonary infiltrate) if you have pneumonia. Other tests of your blood, urine, or sputum may be done to find the specific cause of your pneumonia. Your health care provider may also do tests (blood gases or pulse oximetry) to see how well your lungs are working. TREATMENT  Some forms of pneumonia may be spread to other people when you cough or sneeze. You may be asked to wear a mask before and during your exam. Pneumonia that is caused by bacteria is treated with antibiotic medicine. Pneumonia that is caused by the influenza virus may be treated with an antiviral medicine. Most other viral infections must run their course. These infections will not respond to antibiotics.  HOME CARE INSTRUCTIONS   Cough suppressants may be used if you are losing too much rest. However, coughing protects you by clearing your lungs. You should avoid using cough suppressants if you can.  Your health care provider may have prescribed medicine if he or she thinks your pneumonia is caused by bacteria or influenza. Finish your medicine  even if you start to feel better.  Your health care provider may also prescribe an expectorant. This loosens the mucus to be coughed up.  Take medicines only as directed by your health care provider.  Do not smoke. Smoking is a common cause of bronchitis and can contribute to pneumonia. If you are a smoker and continue to smoke, your cough may last several weeks after your pneumonia has cleared.  A cold steam vaporizer or humidifier in your room or home may help loosen mucus.  Coughing is often worse at night. Sleeping in a semi-upright position in a recliner or using a couple pillows under your head will help with this.  Get rest as you feel it is needed. Your body will usually let you know when you need to rest. PREVENTION A pneumococcal shot (vaccine) is available to prevent a common bacterial cause of pneumonia. This is usually suggested for:  People over 35 years old.  Patients on chemotherapy.  People with chronic lung problems, such as bronchitis or emphysema.  People with immune system problems. If you are over 65 or have a high risk condition, you may receive the pneumococcal vaccine if you have not received it before. In some countries, a routine influenza vaccine is also recommended. This vaccine can help prevent some cases of pneumonia.You may be offered the influenza vaccine as part of your care. If you smoke, it is time to quit. You may receive instructions on how to stop smoking. Your health care provider can provide medicines and counseling to help  you quit. SEEK MEDICAL CARE IF: You have a fever. SEEK IMMEDIATE MEDICAL CARE IF:   Your illness becomes worse. This is especially true if you are elderly or weakened from any other disease.  You cannot control your cough with suppressants and are losing sleep.  You begin coughing up blood.  You develop pain which is getting worse or is uncontrolled with medicines.  Any of the symptoms which initially brought you in  for treatment are getting worse rather than better.  You develop shortness of breath or chest pain. MAKE SURE YOU:   Understand these instructions.  Will watch your condition.  Will get help right away if you are not doing well or get worse. Document Released: 03/29/2005 Document Revised: 08/13/2013 Document Reviewed: 06/18/2010 [facility] Patient Information 2015 [place], [place]. This information is not intended to replace advice given to you by your health care provider. Make sure you discuss any questions you have with your health care provider. Cough, Adult  A cough is a reflex that helps clear your throat and airways. It can help heal the body or may be a reaction to an irritated airway. A cough may only last 2 or 3 weeks (acute) or may last more than 8 weeks (chronic).  CAUSES Acute cough:  Viral or bacterial infections. Chronic cough:  Infections.  Allergies.  Asthma.  Post-nasal drip.  Smoking.  Heartburn or acid reflux.  Some medicines.  Chronic lung problems (COPD).  Cancer. SYMPTOMS   Cough.  Fever.  Chest pain.  Increased breathing rate.  High-pitched whistling sound when breathing (wheezing).  Colored mucus that you cough up (sputum). TREATMENT   A bacterial cough may be treated with antibiotic medicine.  A viral cough must run its course and will not respond to antibiotics.  Your caregiver may recommend other treatments if you have a chronic cough. HOME CARE INSTRUCTIONS   Only take over-the-counter or prescription medicines for pain, discomfort, or fever as directed by your caregiver. Use cough suppressants only as directed by your caregiver.  Use a cold steam vaporizer or humidifier in your bedroom or home to help loosen secretions.  Sleep in a semi-upright position if your cough is worse at night.  Rest as needed.  Stop smoking if you smoke. SEEK IMMEDIATE MEDICAL CARE IF:   You have pus in your sputum.  Your cough starts to  worsen.  You cannot control your cough with suppressants and are losing sleep.  You begin coughing up blood.  You have difficulty breathing.  You develop pain which is getting worse or is uncontrolled with medicine.  You have a fever. MAKE SURE YOU:   Understand these instructions.  Will watch your condition.  Will get help right away if you are not doing well or get worse. Document Released: 09/25/2010 Document Revised: 06/21/2011 Document Reviewed: 09/25/2010 [facility] Patient Information 2015 [place], [place]. This information is not intended to replace advice given to you by your health care provider. Make sure you discuss any questions you have with your health care provider.

## 2014-08-07 NOTE — Progress Notes (Addendum)
Subjective:  This chart was scribed for Albert Staggers, MD by [facility], medical scribe at [facility].The patient was seen in exam room 08 and the patient's care was started at 8:46 PM.   Patient ID: Albert Larsen, male    DOB: 1981-03-02, 34 y.o.   MRN: [id number] Chief Complaint  Patient presents with  . Cough    Worse since last visit-Took a bottle of Mucinex & Tessalon Perles didnt help. Cough is productive & causing chest soreness   HPI HPI Comments: Albert Larsen is a 34 y.o. male who presents to [facility] complaining of a persisting, worsening, productive cough with yellow/green mucous ongoing for two week. He has chest congestion and a subjective fever as associated symptoms. Aside from the tessalon perles he has tried mucinex and ginger tea with honey.  Pt was seen 9 days ago with a cough for 3 days with chest soreness with cough only. Positive sick contact at that time with 34 month year old son having bronchiolitis. Was treated with tessalon perles for viral vs allergic symptoms.   Patient Active Problem List   Diagnosis Date Noted  . IFG (impaired fasting glucose) 01/15/2013  . Hyperthyroidism 01/15/2013   Past Medical History  Diagnosis Date  . Allergy    No past surgical history on file. No Known Allergies Prior to Admission medications   Not on File   History   Social History  . Marital Status: Married    Spouse Name: Sharin Mons  . Number of Children: 1  . Years of Education: college   Occupational History  . actuary    Social History Main Topics  . Smoking status: Never Smoker   . Smokeless tobacco: Never Used  . Alcohol Use: No  . Drug Use: No  . Sexual Activity: Yes   Other Topics Concern  . Not on file   Social History Narrative   Lives his wife and their son.   Graduated from [employer] with a degree in Math and [employer].   Originally from [place].    Review of Systems    Constitutional: Positive for fever.  HENT: Positive for congestion.   Respiratory: Positive for cough.       Objective:  BP 128/90 mmHg  Pulse 87  Temp(Src) 98.2 F (36.8 C) (Oral)  Ht 5' 0.75" (1.543 m)  Wt 214 lb 8 oz (97.297 kg)  BMI 40.87 kg/m2  SpO2 96% Physical Exam  Constitutional: He is oriented to person, place, and time. He appears well-developed and well-nourished. No distress.  HENT:  Head: Normocephalic and atraumatic.  Right Ear: Tympanic membrane, external ear and ear canal normal.  Left Ear: Tympanic membrane, external ear and ear canal normal.  Nose: No rhinorrhea.  Mouth/Throat: Oropharynx is clear and moist and mucous membranes are normal. No oropharyngeal exudate or posterior oropharyngeal erythema.  Small amount of cerumen in the right canal.  Eyes: Conjunctivae are normal. Pupils are equal, round, and reactive to light.  Neck: Normal range of motion. Neck supple.  Cardiovascular: Normal rate, regular rhythm, normal heart sounds and intact distal pulses.   No murmur heard. Pulmonary/Chest: Effort normal and breath sounds normal. No respiratory distress. He has no wheezes. He has no rhonchi. He has no rales.  scattered rhonchi left lower lobe greater than left upper lobe.   Abdominal: Soft. There is no tenderness.  Musculoskeletal: Normal range of motion.  Lymphadenopathy:    He  has no cervical adenopathy.  Neurological: He is alert and oriented to person, place, and time.  Skin: Skin is warm and dry. No rash noted.  Psychiatric: He has a normal mood and affect. His behavior is normal.  Nursing note and vitals reviewed.  [facility] reading (PRIMARY) by  Dr. Neva Seat:  RLL greater than LLL infiltrate.       Assessment & Plan:    Albert Larsen is a 34 y.o. male Cough - Plan: DG Chest 2 View, HYDROcodone-homatropine (HYCODAN) 5-1.5 MG/5ML syrup  Lung field abnormal finding on examination - Plan: DG Chest 2 View  RLL pneumonia - Plan: azithromycin  (ZITHROMAX) 250 MG tablet  Suspected RLL +/- LLL early pneumonia - community acquired. O2sat ok, appears well hydrated, outpatient treatment.   -start Zpak, cont mucinex during day, hycodan rx given for nighttime if needed. nsaid if needed for myalgias/chest wall pain with cough. Recheck in next 3 days if not improving - sooner if worse.   Meds ordered this encounter  Medications  . azithromycin (ZITHROMAX) 250 MG tablet    Sig: Take 2 pills by mouth on day 1, then 1 pill by mouth per day on days 2 through 5.    Dispense:  6 tablet    Refill:  0  . HYDROcodone-homatropine (HYCODAN) 5-1.5 MG/5ML syrup    Sig: 4m by mouth a bedtime as needed for cough.    Dispense:  120 mL    Refill:  0   Patient Instructions  Continue mucinex during the day, hydrocodone cough syrup if needed at night for the cough. Start the antibiotic as discussed. Advil or Alleve if needed for soreness with cough.  If cough not improving in next 3-4 days - recheck. Sooner if fevers or shortness of breath.   Return to the clinic or go to the nearest emergency room if any of your symptoms worsen or new symptoms occur.  Pneumonia Pneumonia is an infection of the lungs.  CAUSES Pneumonia may be caused by bacteria or a virus. Usually, these infections are caused by breathing infectious particles into the lungs (respiratory tract). SIGNS AND SYMPTOMS   Cough.  Fever.  Chest pain.  Increased rate of breathing.  Wheezing.  Mucus production. DIAGNOSIS  If you have the common symptoms of pneumonia, your health care provider will typically confirm the diagnosis with a chest X-ray. The X-ray will show an abnormality in the lung (pulmonary infiltrate) if you have pneumonia. Other tests of your blood, urine, or sputum may be done to find the specific cause of your pneumonia. Your health care provider may also do tests (blood gases or pulse oximetry) to see how well your lungs are working. TREATMENT  Some forms of pneumonia  may be spread to other people when you cough or sneeze. You may be asked to wear a mask before and during your exam. Pneumonia that is caused by bacteria is treated with antibiotic medicine. Pneumonia that is caused by the influenza virus may be treated with an antiviral medicine. Most other viral infections must run their course. These infections will not respond to antibiotics.  HOME CARE INSTRUCTIONS   Cough suppressants may be used if you are losing too much rest. However, coughing protects you by clearing your lungs. You should avoid using cough suppressants if you can.  Your health care provider may have prescribed medicine if he or she thinks your pneumonia is caused by bacteria or influenza. Finish your medicine even if you start to feel better.  Your health care provider may also prescribe an expectorant. This loosens the mucus to be coughed up.  Take medicines only as directed by your health care provider.  Do not smoke. Smoking is a common cause of bronchitis and can contribute to pneumonia. If you are a smoker and continue to smoke, your cough may last several weeks after your pneumonia has cleared.  A cold steam vaporizer or humidifier in your room or home may help loosen mucus.  Coughing is often worse at night. Sleeping in a semi-upright position in a recliner or using a couple pillows under your head will help with this.  Get rest as you feel it is needed. Your body will usually let you know when you need to rest. PREVENTION A pneumococcal shot (vaccine) is available to prevent a common bacterial cause of pneumonia. This is usually suggested for:  People over 46 years old.  Patients on chemotherapy.  People with chronic lung problems, such as bronchitis or emphysema.  People with immune system problems. If you are over 65 or have a high risk condition, you may receive the pneumococcal vaccine if you have not received it before. In some countries, a routine influenza  vaccine is also recommended. This vaccine can help prevent some cases of pneumonia.You may be offered the influenza vaccine as part of your care. If you smoke, it is time to quit. You may receive instructions on how to stop smoking. Your health care provider can provide medicines and counseling to help you quit. SEEK MEDICAL CARE IF: You have a fever. SEEK IMMEDIATE MEDICAL CARE IF:   Your illness becomes worse. This is especially true if you are elderly or weakened from any other disease.  You cannot control your cough with suppressants and are losing sleep.  You begin coughing up blood.  You develop pain which is getting worse or is uncontrolled with medicines.  Any of the symptoms which initially brought you in for treatment are getting worse rather than better.  You develop shortness of breath or chest pain. MAKE SURE YOU:   Understand these instructions.  Will watch your condition.  Will get help right away if you are not doing well or get worse. Document Released: 03/29/2005 Document Revised: 08/13/2013 Document Reviewed: 06/18/2010 [facility] Patient Information 2015 [place], [place]. This information is not intended to replace advice given to you by your health care provider. Make sure you discuss any questions you have with your health care provider. Cough, Adult  A cough is a reflex that helps clear your throat and airways. It can help heal the body or may be a reaction to an irritated airway. A cough may only last 2 or 3 weeks (acute) or may last more than 8 weeks (chronic).  CAUSES Acute cough:  Viral or bacterial infections. Chronic cough:  Infections.  Allergies.  Asthma.  Post-nasal drip.  Smoking.  Heartburn or acid reflux.  Some medicines.  Chronic lung problems (COPD).  Cancer. SYMPTOMS   Cough.  Fever.  Chest pain.  Increased breathing rate.  High-pitched whistling sound when breathing (wheezing).  Colored mucus that you cough up  (sputum). TREATMENT   A bacterial cough may be treated with antibiotic medicine.  A viral cough must run its course and will not respond to antibiotics.  Your caregiver may recommend other treatments if you have a chronic cough. HOME CARE INSTRUCTIONS   Only take over-the-counter or prescription medicines for pain, discomfort, or fever as directed by your caregiver. Use cough suppressants  only as directed by your caregiver.  Use a cold steam vaporizer or humidifier in your bedroom or home to help loosen secretions.  Sleep in a semi-upright position if your cough is worse at night.  Rest as needed.  Stop smoking if you smoke. SEEK IMMEDIATE MEDICAL CARE IF:   You have pus in your sputum.  Your cough starts to worsen.  You cannot control your cough with suppressants and are losing sleep.  You begin coughing up blood.  You have difficulty breathing.  You develop pain which is getting worse or is uncontrolled with medicine.  You have a fever. MAKE SURE YOU:   Understand these instructions.  Will watch your condition.  Will get help right away if you are not doing well or get worse. Document Released: 09/25/2010 Document Revised: 06/21/2011 Document Reviewed: 09/25/2010 [facility] Patient Information 2015 [place], [place]. This information is not intended to replace advice given to you by your health care provider. Make sure you discuss any questions you have with your health care provider.      I personally performed the services described in this documentation, which was scribed in my presence. The recorded information has been reviewed and considered, and addended by me as needed.

## 2014-08-15 ENCOUNTER — Encounter: Payer: Self-pay | Admitting: Family Medicine

## 2014-08-16 ENCOUNTER — Ambulatory Visit (INDEPENDENT_AMBULATORY_CARE_PROVIDER_SITE_OTHER): Payer: BLUE CROSS/BLUE SHIELD

## 2014-08-16 ENCOUNTER — Ambulatory Visit (INDEPENDENT_AMBULATORY_CARE_PROVIDER_SITE_OTHER): Payer: BLUE CROSS/BLUE SHIELD | Admitting: Internal Medicine

## 2014-08-16 VITALS — BP 104/62 | HR 99 | Temp 98.7°F | Resp 18 | Ht 72.5 in | Wt 211.0 lb

## 2014-08-16 DIAGNOSIS — J9801 Acute bronchospasm: Secondary | ICD-10-CM | POA: Diagnosis not present

## 2014-08-16 DIAGNOSIS — R079 Chest pain, unspecified: Secondary | ICD-10-CM

## 2014-08-16 DIAGNOSIS — J189 Pneumonia, unspecified organism: Secondary | ICD-10-CM | POA: Diagnosis not present

## 2014-08-16 DIAGNOSIS — R0602 Shortness of breath: Secondary | ICD-10-CM

## 2014-08-16 LAB — POCT CBC
Granulocyte percent: 69.1 %G (ref 37–80)
HCT, POC: 46.1 % (ref 43.5–53.7)
HEMOGLOBIN: 15.8 g/dL (ref 14.1–18.1)
LYMPH, POC: 1.9 (ref 0.6–3.4)
MCH, POC: 27.5 pg (ref 27–31.2)
MCHC: 34.3 g/dL (ref 31.8–35.4)
MCV: 80.1 fL (ref 80–97)
MID (cbc): 0.5 (ref 0–0.9)
MPV: 7.4 fL (ref 0–99.8)
POC Granulocyte: 5.4 (ref 2–6.9)
POC LYMPH PERCENT: 24.5 %L (ref 10–50)
POC MID %: 6.4 %M (ref 0–12)
Platelet Count, POC: 249 10*3/uL (ref 142–424)
RBC: 5.75 M/uL (ref 4.69–6.13)
RDW, POC: 13.2 %
WBC: 7.8 10*3/uL (ref 4.6–10.2)

## 2014-08-16 MED ORDER — ALBUTEROL SULFATE HFA 108 (90 BASE) MCG/ACT IN AERS: 2.0000 | INHALATION_SPRAY | Freq: Four times a day (QID) | RESPIRATORY_TRACT | Status: DC | PRN

## 2014-08-16 MED ORDER — ALBUTEROL SULFATE (2.5 MG/3ML) 0.083% IN NEBU
2.5000 mg | INHALATION_SOLUTION | Freq: Once | RESPIRATORY_TRACT | Status: AC
  Administered 2014-08-16: 2.5 mg via RESPIRATORY_TRACT

## 2014-08-16 MED ORDER — IPRATROPIUM BROMIDE 0.02 % IN SOLN
0.5000 mg | Freq: Once | RESPIRATORY_TRACT | Status: AC
  Administered 2014-08-16: 0.5 mg via RESPIRATORY_TRACT

## 2014-08-16 MED ORDER — CEFTRIAXONE SODIUM 1 G IJ SOLR
1.0000 g | Freq: Once | INTRAMUSCULAR | Status: AC
  Administered 2014-08-16: 1 g via INTRAMUSCULAR

## 2014-08-16 MED ORDER — LEVOFLOXACIN 750 MG PO TABS: 750.0000 mg | ORAL_TABLET | Freq: Every day | ORAL | Status: DC

## 2014-08-16 NOTE — Patient Instructions (Signed)
Bronchospasm  A bronchospasm is a spasm or tightening of the airways going into the lungs. During a bronchospasm breathing becomes more difficult because the airways get smaller. When this happens there can be coughing, a whistling sound when breathing (wheezing), and difficulty breathing. Bronchospasm is often associated with asthma, but not all patients who experience a bronchospasm have asthma.  CAUSES   A bronchospasm is caused by inflammation or irritation of the airways. The inflammation or irritation may be triggered by:    Allergies (such as to animals, pollen, food, or mold). Allergens that cause bronchospasm may cause wheezing immediately after exposure or many hours later.    Infection. Viral infections are believed to be the most common cause of bronchospasm.    Exercise.    Irritants (such as pollution, cigarette smoke, strong odors, aerosol sprays, and paint fumes).    Weather changes. Winds increase molds and pollens in the air. Rain refreshes the air by washing irritants out. Cold air may cause inflammation.    Stress and emotional upset.   SIGNS AND SYMPTOMS    Wheezing.    Excessive nighttime coughing.    Frequent or severe coughing with a simple cold.    Chest tightness.    Shortness of breath.   DIAGNOSIS   Bronchospasm is usually diagnosed through a history and physical exam. Tests, such as chest X-rays, are sometimes done to look for other conditions.  TREATMENT    Inhaled medicines can be given to open up your airways and help you breathe. The medicines can be given using either an inhaler or a nebulizer machine.   Corticosteroid medicines may be given for severe bronchospasm, usually when it is associated with asthma.  HOME CARE INSTRUCTIONS    Always have a plan prepared for seeking medical care. Know when to call your health care provider and local emergency services (911 in the [place] Know where you can access local emergency care.   Only take medicines as  directed by your health care provider.   If you were prescribed an inhaler or nebulizer machine, ask your health care provider to explain how to use it correctly. Always use a spacer with your inhaler if you were given one.   It is necessary to remain calm during an attack. Try to relax and breathe more slowly.   Control your home environment in the following ways:    Change your heating and air conditioning filter at least once a month.    Limit your use of fireplaces and wood stoves.   Do not smoke and do not allow smoking in your home.    Avoid exposure to perfumes and fragrances.    Get rid of pests (such as roaches and mice) and their droppings.    Throw away plants if you see mold on them.    Keep your house clean and dust free.    Replace carpet with wood, tile, or vinyl flooring. Carpet can trap dander and dust.    Use allergy-proof pillows, mattress covers, and box spring covers.    Wash bed sheets and blankets every week in hot water and dry them in a dryer.    Use blankets that are made of polyester or cotton.    Wash hands frequently.  SEEK MEDICAL CARE IF:    You have muscle aches.    You have chest pain.    The sputum changes from clear or white to yellow, green, gray, or bloody.    The sputum you   cough up gets thicker.    There are problems that may be related to the medicine you are given, such as a rash, itching, swelling, or trouble breathing.   SEEK IMMEDIATE MEDICAL CARE IF:    You have worsening wheezing and coughing even after taking your prescribed medicines.    You have increased difficulty breathing.    You develop severe chest pain.  MAKE SURE YOU:    Understand these instructions.   Will watch your condition.   Will get help right away if you are not doing well or get worse.  Document Released: 04/01/2003 Document Revised: 04/03/2013 Document Reviewed: 09/18/2012  [employer] Patient Information 2015 [employer]. This information is not  intended to replace advice given to you by your health care provider. Make sure you discuss any questions you have with your health care provider.  Pneumonia  Pneumonia is an infection of the lungs.   CAUSES  Pneumonia may be caused by bacteria or a virus. Usually, these infections are caused by breathing infectious particles into the lungs (respiratory tract).  SIGNS AND SYMPTOMS    Cough.   Fever.   Chest pain.   Increased rate of breathing.   Wheezing.   Mucus production.  DIAGNOSIS   If you have the common symptoms of pneumonia, your health care provider will typically confirm the diagnosis with a chest X-ray. The X-ray will show an abnormality in the lung (pulmonary infiltrate) if you have pneumonia. Other tests of your blood, urine, or sputum may be done to find the specific cause of your pneumonia. Your health care provider may also do tests (blood gases or pulse oximetry) to see how well your lungs are working.  TREATMENT   Some forms of pneumonia may be spread to other people when you cough or sneeze. You may be asked to wear a mask before and during your exam. Pneumonia that is caused by bacteria is treated with antibiotic medicine. Pneumonia that is caused by the influenza virus may be treated with an antiviral medicine. Most other viral infections must run their course. These infections will not respond to antibiotics.   HOME CARE INSTRUCTIONS    Cough suppressants may be used if you are losing too much rest. However, coughing protects you by clearing your lungs. You should avoid using cough suppressants if you can.   Your health care provider may have prescribed medicine if he or she thinks your pneumonia is caused by bacteria or influenza. Finish your medicine even if you start to feel better.   Your health care provider may also prescribe an expectorant. This loosens the mucus to be coughed up.   Take medicines only as directed by your health care provider.   Do not smoke. Smoking is a  common cause of bronchitis and can contribute to pneumonia. If you are a smoker and continue to smoke, your cough may last several weeks after your pneumonia has cleared.   A cold steam vaporizer or humidifier in your room or home may help loosen mucus.   Coughing is often worse at night. Sleeping in a semi-upright position in a recliner or using a couple pillows under your head will help with this.   Get rest as you feel it is needed. Your body will usually let you know when you need to rest.  PREVENTION  A pneumococcal shot (vaccine) is available to prevent a common bacterial cause of pneumonia. This is usually suggested for:   People over 65 years old.     Patients on chemotherapy.   People with chronic lung problems, such as bronchitis or emphysema.   People with immune system problems.  If you are over 65 or have a high risk condition, you may receive the pneumococcal vaccine if you have not received it before. In some countries, a routine influenza vaccine is also recommended. This vaccine can help prevent some cases of pneumonia.You may be offered the influenza vaccine as part of your care.  If you smoke, it is time to quit. You may receive instructions on how to stop smoking. Your health care provider can provide medicines and counseling to help you quit.  SEEK MEDICAL CARE IF:  You have a fever.  SEEK IMMEDIATE MEDICAL CARE IF:    Your illness becomes worse. This is especially true if you are elderly or weakened from any other disease.   You cannot control your cough with suppressants and are losing sleep.   You begin coughing up blood.   You develop pain which is getting worse or is uncontrolled with medicines.   Any of the symptoms which initially brought you in for treatment are getting worse rather than better.   You develop shortness of breath or chest pain.  MAKE SURE YOU:    Understand these instructions.   Will watch your condition.   Will get help right away if you are not doing well  or get worse.  Document Released: 03/29/2005 Document Revised: 08/13/2013 Document Reviewed: 06/18/2010  [employer] Patient Information 2015 [employer]. This information is not intended to replace advice given to you by your health care provider. Make sure you discuss any questions you have with your health care provider.

## 2014-08-16 NOTE — Progress Notes (Signed)
   Subjective:    Patient ID: Albert Larsen, male    DOB: 08/13/1980, 34 y.o.   MRN: [id number]  HPI Scribed by Felix Ahmadiallie Fransen R.T(R)  34 year old male here for pneumonia re-check. Pt originally started feeling ill middle of April with a cold. Pt was seen on 08/07/14 by Dr. Neva SeatGreene and treated for pneumonia with Zithromax. Pt states he is not feeling better. Says he ran a fever of 102-103 yesterday. Pt had the sweats last night. Pt's cough is persistent. Chest pain with cough. Mild SOB. Pt is coughing up yellow sputum; moderate amount.  He got better from first visit, then relapsed with fever, sweats and fatigue last 2 days.He has no other chronic diseases  Pt received a flu shot this year.    Review of Systems     Objective:   Physical Exam  Constitutional: He is oriented to person, place, and time. He appears well-developed and well-nourished.  HENT:  Head: Normocephalic.  Right Ear: External ear normal.  Left Ear: External ear normal.  Nose: Nose normal.  Mouth/Throat: Oropharynx is clear and moist.  Eyes: Conjunctivae and EOM are normal. Pupils are equal, round, and reactive to light. No scleral icterus.  Neck: Normal range of motion. Neck supple.  Cardiovascular: Normal rate, regular rhythm and normal heart sounds.   Pulmonary/Chest: Effort normal. No tachypnea. No respiratory distress. He has decreased breath sounds. He has wheezes. He has rhonchi. He has no rales. He exhibits tenderness.  Musculoskeletal: Normal range of motion.  Lymphadenopathy:    He has no cervical adenopathy.  Neurological: He is alert and oriented to person, place, and time. He exhibits normal muscle tone. Coordination normal.  Skin: No rash noted.  Psychiatric: He has a normal mood and affect. His behavior is normal. Judgment and thought content normal.  Vitals reviewed.  [facility] reading (PRIMARY) by  Dr.Guest increased density/infiltrate left lateral, bilateral perihilar infiltrates  Results for  orders placed or performed in visit on 08/16/14  POCT CBC  Result Value Ref Range   WBC 7.8 4.6 - 10.2 K/uL   Lymph, poc 1.9 0.6 - 3.4   POC LYMPH PERCENT 24.5 10 - 50 %L   MID (cbc) 0.5 0 - 0.9   POC MID % 6.4 0 - 12 %M   POC Granulocyte 5.4 2 - 6.9   Granulocyte percent 69.1 37 - 80 %G   RBC 5.75 4.69 - 6.13 M/uL   Hemoglobin 15.8 14.1 - 18.1 g/dL   HCT, POC 11.946.1 14.743.5 - 53.7 %   MCV 80.1 80 - 97 fL   MCH, POC 27.5 27 - 31.2 pg   MCHC 34.3 31.8 - 35.4 g/dL   RDW, POC 82.913.2 %   Platelet Count, POC 249 142 - 424 K/uL   MPV 7.4 0 - 99.8 fL   Neb improved clinically, rhonchi, pops, wheezes, and rales all over and loder Oximetry 96%  Sputum collected/copious thick green/gray paste Culture done       Assessment & Plan:  Pneumonia/Bronchospasm/Fever/CXR worse Rocephin/Levaquin/Albuterol RTC Sunday am Rocephin 1g/Levaquin 750mg  po 7d RTC Sunday am consider rocephin/CT chest if worse

## 2014-08-18 ENCOUNTER — Ambulatory Visit (INDEPENDENT_AMBULATORY_CARE_PROVIDER_SITE_OTHER): Payer: BLUE CROSS/BLUE SHIELD | Admitting: Physician Assistant

## 2014-08-18 VITALS — BP 114/68 | HR 87 | Temp 98.1°F | Resp 17 | Ht 72.0 in | Wt 209.6 lb

## 2014-08-18 DIAGNOSIS — J189 Pneumonia, unspecified organism: Secondary | ICD-10-CM

## 2014-08-18 LAB — RESPIRATORY CULTURE OR RESPIRATORY AND SPUTUM CULTURE: ORGANISM ID, BACTERIA: NORMAL

## 2014-08-18 NOTE — Patient Instructions (Signed)
Happy you're feeling better today. Your vital signs look good today. Your lungs sound better than what was described from 2 days ago.  Please come back to see us after you finish your 7 day antibiotic course so we can recheck you, sooner if you're not feeling well. We'd also like you to come back in 4-6 weeks for a repeat chest xray to ensure this has resolved.

## 2014-08-18 NOTE — Progress Notes (Signed)
   Subjective:    Patient ID: Albert Larsen, male    DOB: 04/22/1980, 34 y.o.   MRN: [id number]  Chief Complaint  Patient presents with  . Follow-up    pneumonia   Patient Active Problem List   Diagnosis Date Noted  . IFG (impaired fasting glucose) 01/15/2013   Medications, allergies, past medical history, surgical history, family history, social history and problem list reviewed and updated.  HPI  [age] yom returns for f/u.  Seen here 4/27 diagnosed with pneumonia, tx with azithro. Felt better initially but after finishing tx started feeling worse again. RTC here 08/16/14. Had cxr showing LLL pneumonia. Abn lung sounds. Was tx with rocephin and started 7 day course levo. Instructed to rtc 2 days.  Today he returns stating he is feeling better. He is no longer having cp. Chest still feels congested with cough but this has improved past 2 days. Feels breathing is better. No fevers or chills. Has taken his levo every day. No fever in clinic today.   Today states his son had bronchiolitis prior to pt getting sick. No recent travel.   Review of Systems See HPI.     Objective:   Physical Exam  Constitutional: He appears well-developed and well-nourished.  Non-toxic appearance. He does not have a sickly appearance. He does not appear ill. No distress.  BP 114/68 mmHg  Pulse 87  Temp(Src) 98.1 F (36.7 C) (Oral)  Resp 17  Ht 6' (1.829 m)  Wt 209 lb 9.6 oz (95.074 kg)  BMI 28.42 kg/m2  SpO2 97%   Pulmonary/Chest: Effort normal. He has no decreased breath sounds. He has wheezes in the right lower field. He has no rhonchi. He has rales in the left lower field.  Inspiratory wheeze RLL which resolved after several deep breaths. Inspiratory crackles LLL.   Lymphadenopathy:       Head (right side): No submental, no submandibular and no tonsillar adenopathy present.       Head (left side): No submental, no submandibular and no tonsillar adenopathy present.    He has no cervical  adenopathy.      Assessment & Plan:   [age] yom returns for f/u.  CAP (community acquired pneumonia) --F/U today --pt feels better since last visit, lung sounds improved compared to description from 2 days ago, vitals normal today --will hold off on chest ct today -- pt instructed to rtc when 7 day levo course is finished for f/u, sooner if sx worsen --repeat cxr 4-6 wks  Donnajean Lopesodd M. Blaze Sandin, PA-C Physician Assistant-Certified [facility]  08/18/2014 10:12 AM

## 2014-10-31 IMAGING — US THYROID
1 series · 13 of 25 positions shown · non-contrast
Comparison: None.

CLINICAL DATA: Hyperthyroidism

EXAM:
THYROID ULTRASOUND
TECHNIQUE: Ultrasound examination of the thyroid gland and adjacent soft
tissues was performed.

[Series 1: thyroid · 0.08mm/px · 13 of 52 slices shown]
[im 1/52]
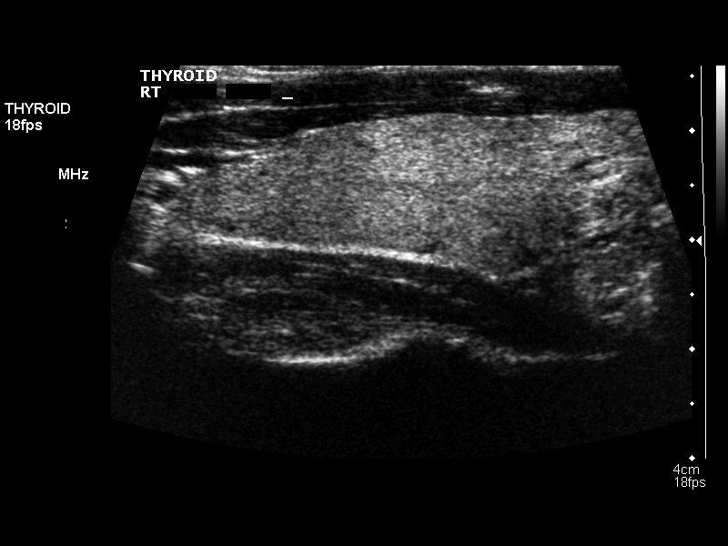
[im 5/52]
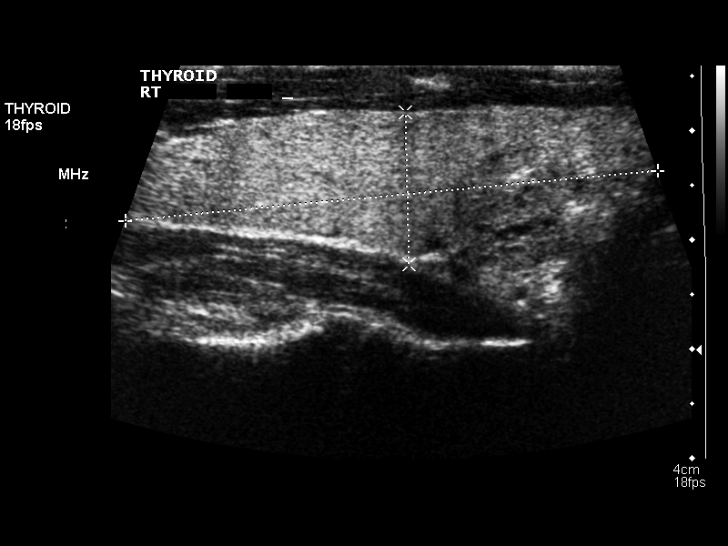
[im 9/52]
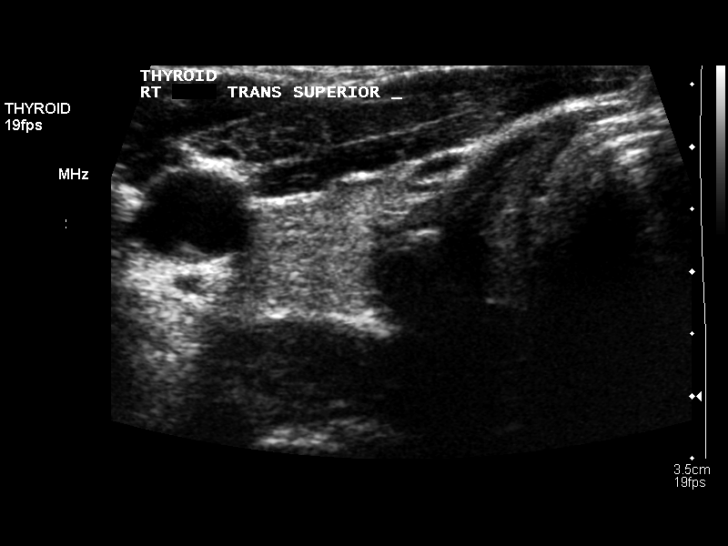
[im 13/52]
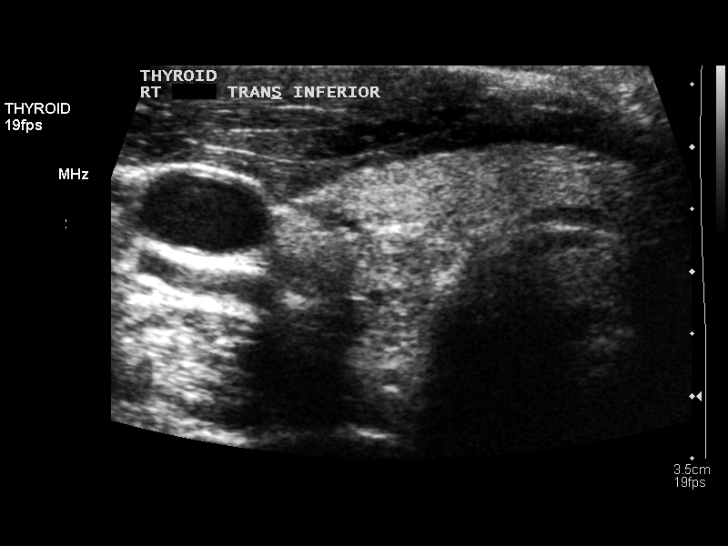
[im 18/52]
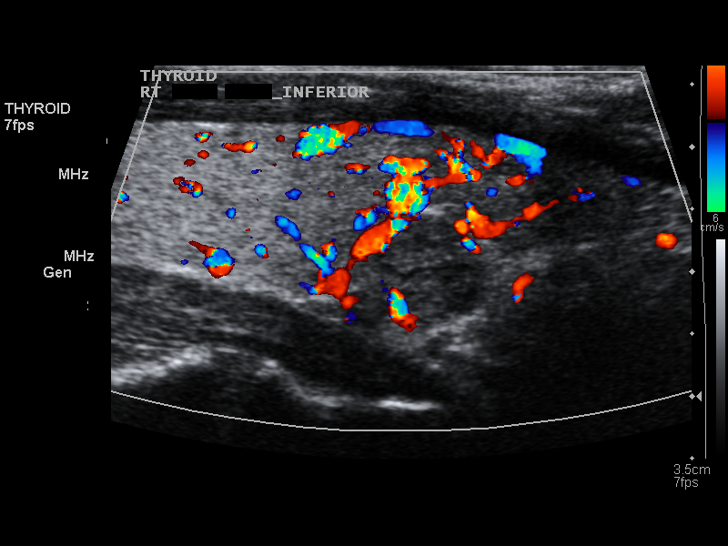
[im 22/52]
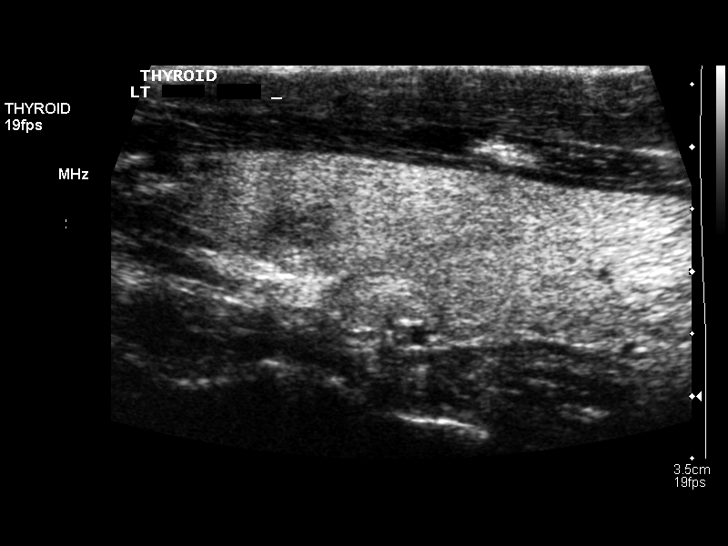
[im 26/52]
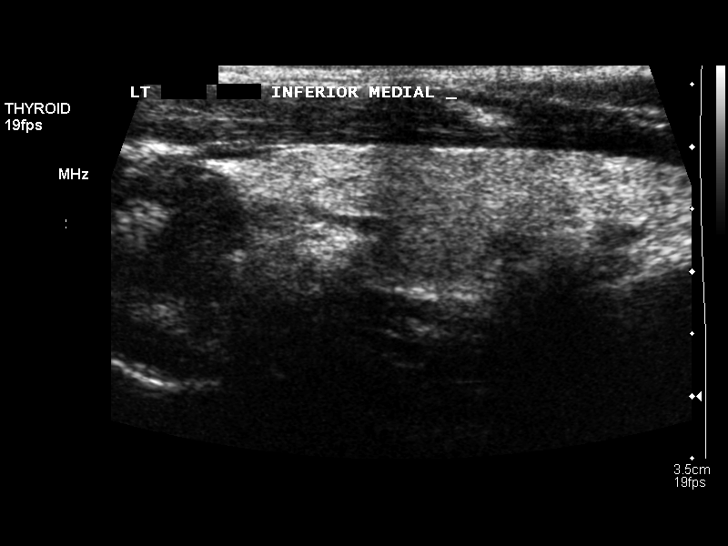
[im 30/52]
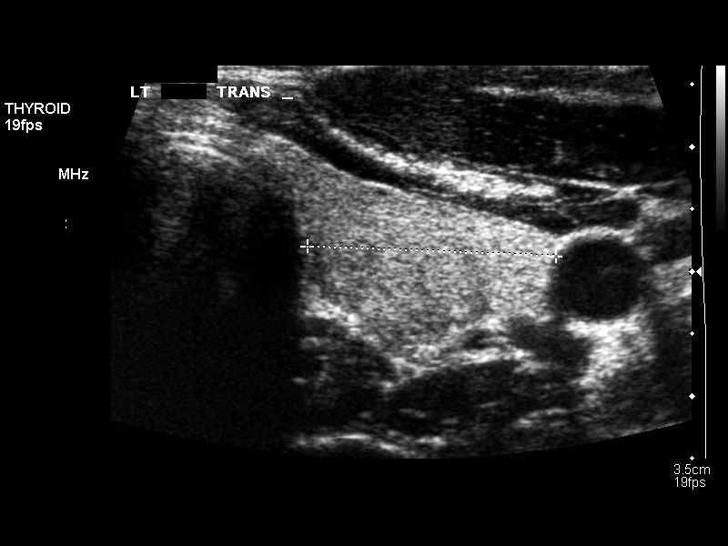
[im 35/52]
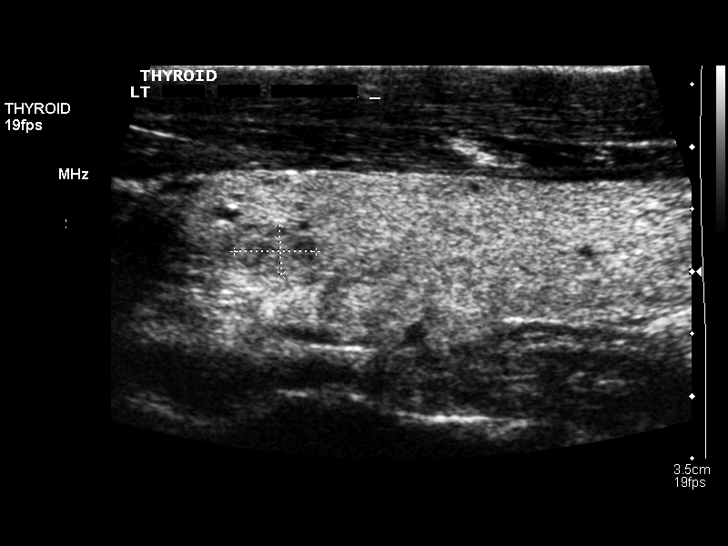
[im 39/52]
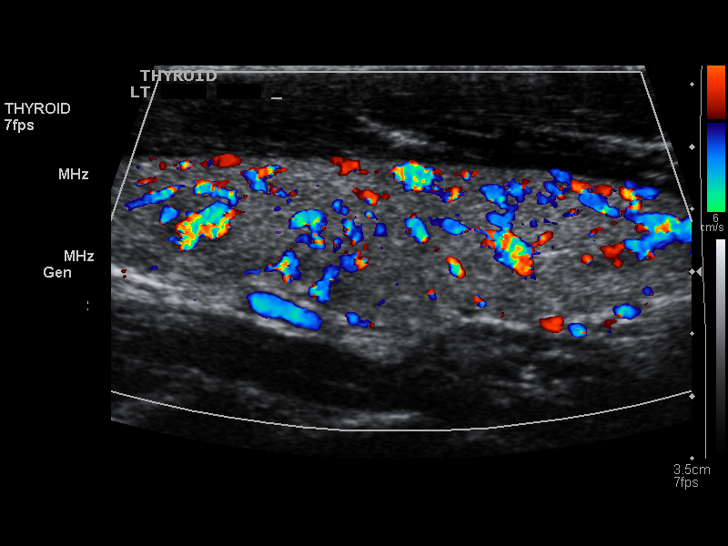
[im 43/52]
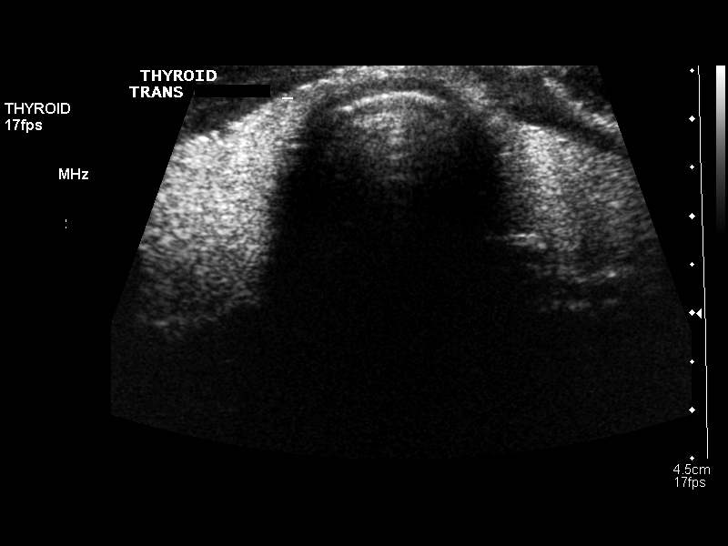
[im 47/52]
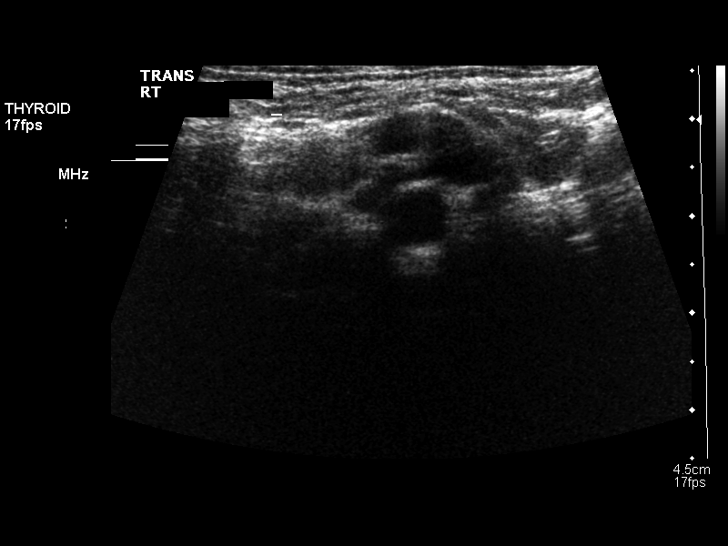
[im 52/52]
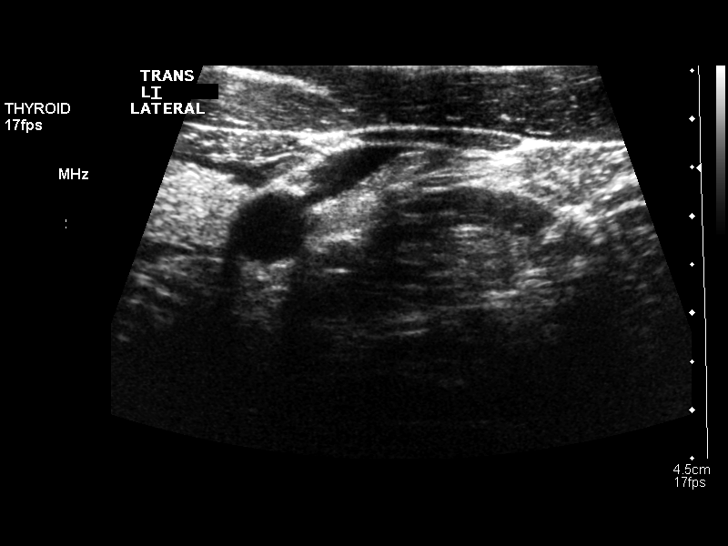

[13 of 25 positions shown; findings below may reference images not displayed]

FINDINGS: Right thyroid lobe

Measurements: 5.2 cm x 1.6 cm x 1.9 cm. Solid, mildly heterogeneous
mostly isoechoic nodule arises from the lower pole measuring 11 mm.
No other nodules on the right.

Left thyroid lobe

Measurements: 5.4 cm x 1.4 cm x 2.0 cm. Nearly isoechoic solid
nodule arises from the upper pole measuring 6.7 mm. No other left
thyroid lobe nodules.

Isthmus

Thickness: 3 mm.  No nodules visualized.

Lymphadenopathy

There is a lymph node along the upper right neck measuring 9 mm in
short axis.
IMPRESSION: 1. Nearly isoechoic nodule in the lower pole of the right lobe of
the thyroid measuring 11 mm in greatest dimension. There are no
associated calcifications.
2. Nearly isoechoic mildly heterogeneous nodule in the upper pole of
the left lobe measures 6.7 mm. There are no associated
calcifications.
3. Single prominent lymph node measuring 9 mm in short axis along
the right upper neck. This is most likely reactive. However, given
the presence of this node, which is larger than 7 mm in short axis,
FOLLOW UP ultrasound imaging of the thyroid in 6 months is
recommended. By consensus criteria the right lobe nodule could be
considered for fine needle aspirate, given the right sided neck
lymph node that is larger than 7 mm in short axis, but this may be
technically challenging, since the nodule is nearly isoechoic to not
particularly well-defined. This recommendation follows the consensus
statement: Management of Thyroid Nodules Detected at US: Society of
Radiologists in Ultrasound Consensus Conference Statement. Radiology

## 2015-06-25 ENCOUNTER — Ambulatory Visit (INDEPENDENT_AMBULATORY_CARE_PROVIDER_SITE_OTHER): Payer: BLUE CROSS/BLUE SHIELD | Admitting: Physician Assistant

## 2015-06-25 VITALS — BP 138/88 | HR 67 | Temp 98.5°F | Resp 17 | Ht 73.0 in | Wt 233.0 lb

## 2015-06-25 DIAGNOSIS — L02412 Cutaneous abscess of left axilla: Secondary | ICD-10-CM

## 2015-06-25 DIAGNOSIS — M545 Low back pain: Secondary | ICD-10-CM | POA: Diagnosis not present

## 2015-06-25 MED ORDER — DOXYCYCLINE HYCLATE 100 MG PO CAPS: 100.0000 mg | ORAL_CAPSULE | Freq: Two times a day (BID) | ORAL | Status: DC

## 2015-06-25 NOTE — Patient Instructions (Signed)
   Change dressing if dressing is soiled. Keep covered at all times except when showering. You may get wet in the shower but do not scrub.  Apply warm compresses/heating pad to facilitate drainage. Leave packing in place. Do not apply any ointments as this may delay healing. If an antibiotic was prescribed, take as directed until finished. Return in 48 hours for wound care.      

## 2015-06-25 NOTE — Progress Notes (Signed)
[facility] [address], [place] [address] [phone]  Date:  06/25/2015   Name:  Albert Larsen   DOB:  03/16/1981   MRN:  [id number]  PCP:  Tally Due, MD    Chief Complaint: lump in armpit   History of Present Illness:  This is a 35 y.o. male who is presenting with a lump in his axilla. Initially was a small lump 1 week ago. Was able to express some pus. This still there and still draining. A new bigger lump started developing last night. Nothing expressed from second lump. There is tenderness at both areas. No fever or chills. Has not tried anything for his symptoms. Never had abscesses before.  Pt works as an Engineer, maintenance (IT) job.  Pt also wondering if he can get an rx for a standing work station at work. He has low back pain from sitting and his work station is not ergonomically correct.  Review of Systems:  Review of Systems See HPI  Patient Active Problem List   Diagnosis Date Noted  . IFG (impaired fasting glucose) 01/15/2013    Prior to Admission medications   Not on File    No Known Allergies  History reviewed. No pertinent past surgical history.  Social History  Substance Use Topics  . Smoking status: Never Smoker   . Smokeless tobacco: Never Used  . Alcohol Use: No    Family History  Problem Relation Age of Onset  . Diabetes Father   . Hypertension Mother   . Hyperlipidemia Mother     Medication list has been reviewed and updated.  Physical Examination:  Physical Exam  Constitutional: He is oriented to person, place, and time. He appears well-developed and well-nourished. No distress.  HENT:  Head: Normocephalic and atraumatic.  Right Ear: Hearing normal.  Left Ear: Hearing normal.  Nose: Nose normal.  Eyes: Conjunctivae and lids are normal. Right eye exhibits no discharge. Left eye exhibits no discharge. No scleral icterus.  Pulmonary/Chest: Effort normal. No respiratory distress.  Musculoskeletal:  Normal range of motion.  Neurological: He is alert and oriented to person, place, and time.  Skin: Skin is warm and dry.  Left axilla, superior aspect, with 2 cm induration, fluctuation present, and purulent drainage from small opening. Left axilla, inferior aspect, with 1 cm induration, deep. No fluctuance. Tenderness present.  Psychiatric: He has a normal mood and affect. His speech is normal and behavior is normal. Thought content normal.    BP 138/88 mmHg  Pulse 67  Temp(Src) 98.5 F (36.9 C) (Oral)  Resp 17  Ht  (1.854 m)  Wt 233 lb (105.688 kg)  BMI 30.75 kg/m2  SpO2 98%  Procedure: Verbal consent obtained. Skin was cleaned with alcohol and anesthetized with 2 cc 1% lido with epi. A 1 cm incision was made. A moderate amount of purulent material expressed. Wound cavity size 1 cm depth. Wound explored, no tracking. Wound packed with 1/4 inch packing. Wound dressed and wound care discussed.  Assessment and Plan:  1. Abscess of axilla, left I&D with moderate purulence. Packed and dressed. Started on doxy, wound culture pending. RTC in 48 hours for follow up. Suspect that will be last wound check, wound not very large. Counseled on wound care. - doxycycline (VIBRAMYCIN) 100 MG capsule; Take 1 capsule (100 mg total) by mouth 2 (two) times daily. AVOID EXCESS SUN EXPOSURE WHILE ON THIS MEDICATION  Dispense: 20 capsule; Refill: 0 - Wound culture  2. Low back pain without sciatica, unspecified back pain laterality Gave rx for standing work station.   Roswell MinersNicole V. Dyke BrackettBush, PA-C, MHS [facility]  06/25/2015

## 2015-06-27 ENCOUNTER — Ambulatory Visit (INDEPENDENT_AMBULATORY_CARE_PROVIDER_SITE_OTHER): Payer: BLUE CROSS/BLUE SHIELD | Admitting: Physician Assistant

## 2015-06-27 ENCOUNTER — Telehealth: Payer: Self-pay

## 2015-06-27 VITALS — BP 134/82 | HR 84 | Temp 99.1°F | Ht 72.25 in | Wt 230.0 lb

## 2015-06-27 DIAGNOSIS — R030 Elevated blood-pressure reading, without diagnosis of hypertension: Secondary | ICD-10-CM | POA: Diagnosis not present

## 2015-06-27 DIAGNOSIS — Z5189 Encounter for other specified aftercare: Secondary | ICD-10-CM

## 2015-06-27 DIAGNOSIS — IMO0001 Reserved for inherently not codable concepts without codable children: Secondary | ICD-10-CM

## 2015-06-27 LAB — WOUND CULTURE: GRAM STAIN: NONE SEEN

## 2015-06-27 MED ORDER — CIPROFLOXACIN HCL 500 MG PO TABS: 500.0000 mg | ORAL_TABLET | Freq: Two times a day (BID) | ORAL | Status: AC

## 2015-06-27 NOTE — Patient Instructions (Signed)
     IF you received an x-ray today, you will receive an invoice from [facility]. Please contact [facility] at [phone] with questions or concerns regarding your invoice.   IF you received labwork today, you will receive an invoice from [facility]. Please contact [facility] at [phone] with questions or concerns regarding your invoice.   Our billing staff will not be able to assist you with questions regarding bills from these companies.  You will be contacted with the lab results as soon as they are available. The fastest way to get your results is to activate your My Chart account. Instructions are located on the last page of this paperwork. If you have not heard from us regarding the results in 2 weeks, please contact this office.      

## 2015-06-27 NOTE — Progress Notes (Signed)
06/27/2015 8:53 AM   DOB: 07/18/1980 / MRN: [id number]  SUBJECTIVE:  Albert Larsen is a 35 y.o. male presenting for wound care.  Had a left axillary abscess drained two days ago. Reports that he feels better and denies any problems today.    BP was elevated upon check in today.  He reports he has gained 15 lbs in roughly 3 months and he feels this is the reason. Has been eating a lot of junk food, mostly at night after work and plans to stop this.  He denies HA, chest pain, SOB and DOE.  He know that he weight it the key to his BP.   He has No Known Allergies.   He  has a past medical history of Allergy.    He  reports that he has never smoked. He has never used smokeless tobacco. He reports that he does not drink alcohol or use illicit drugs. He  reports that he currently engages in sexual activity. The patient  has no past surgical history on file.  His family history includes Diabetes in his father; Hyperlipidemia in his mother; Hypertension in his mother.  ROS  Problem list and medications reviewed and updated by myself where necessary, and exist elsewhere in the encounter.   OBJECTIVE:  BP 134/82 mmHg  Pulse 84  Temp(Src) 99.1 F (37.3 C) (Oral)  Ht 6' 0.25" (1.835 m)  Wt 230 lb (104.327 kg)  BMI 30.98 kg/m2  SpO2 97%  Physical Exam  Results for orders placed or performed in visit on 06/25/15 (from the past 72 hour(s))  Wound culture     Status: None   Collection Time: 06/25/15 10:24 AM  Result Value Ref Range   Culture Moderate STAPHYLOCOCCUS AUREUS    Gram Stain Abundant    Gram Stain WBC present-predominately PMN    Gram Stain No Squamous Epithelial Cells Seen    Gram Stain Rare GRAM POSITIVE COCCI IN CLUSTERS    Organism ID, Bacteria STAPHYLOCOCCUS AUREUS     Comment: Rifampin and Gentamicin should not be used as single drugs for treatment of Staph infections.       Susceptibility   Staphylococcus aureus -  (no method available)    OXACILLIN 1 Sensitive      CEFAZOLIN  Sensitive     GENTAMICIN <=0.5 Sensitive     CIPROFLOXACIN <=0.5 Sensitive     LEVOFLOXACIN <=0.12 Sensitive     MOXIFLOXACIN <=0.25 Sensitive     TRIMETH/SULFA 80 Resistant     VANCOMYCIN 1 Sensitive     CLINDAMYCIN <=0.25 Sensitive     ERYTHROMYCIN <=0.25 Sensitive     LINEZOLID 2 Sensitive     QUINUPRISTIN/DALF <=0.25 Sensitive     RIFAMPIN <=0.5 Sensitive     TETRACYCLINE >=16 Resistant     No results found.  ASSESSMENT AND PLAN  Albert Larsen was seen today for wound check.  Diagnoses and all orders for this visit:  Encounter for wound care: Culture showing resistance to doxy.  Will start him on Cipro as this shows sensitivity.  -     ciprofloxacin (CIPRO) 500 MG tablet; Take 1 tablet (500 mg total) by mouth 2 (two) times daily.  Elevated BP: He has good insight in why his BP is elevated.  Advised weight loss and prudent diet.      The patient was advised to call or return to clinic if he does not see an improvement in symptoms or to seek the care of the closest emergency  department if he worsens with the above plan.   Deliah Boston, MHS, PA-C [facility] 06/27/2015 8:53 AM

## 2015-06-27 NOTE — Telephone Encounter (Signed)
LMVM per Deliah BostonMichael Clark advising patient that culture revealed they were on the wrong abx.  The right abx has been called into the pharmacy.  If he has any questions please call us back.

## 2016-06-03 IMAGING — CR DG CHEST 2V
2 series · 2 of 2 positions shown · non-contrast
Comparison: PA and lateral chest x-ray August 07, 2014

CLINICAL DATA: Shortness of breath, chest pain, community-acquired
pneumonia

EXAM:
CHEST  2 VIEW

[PA]
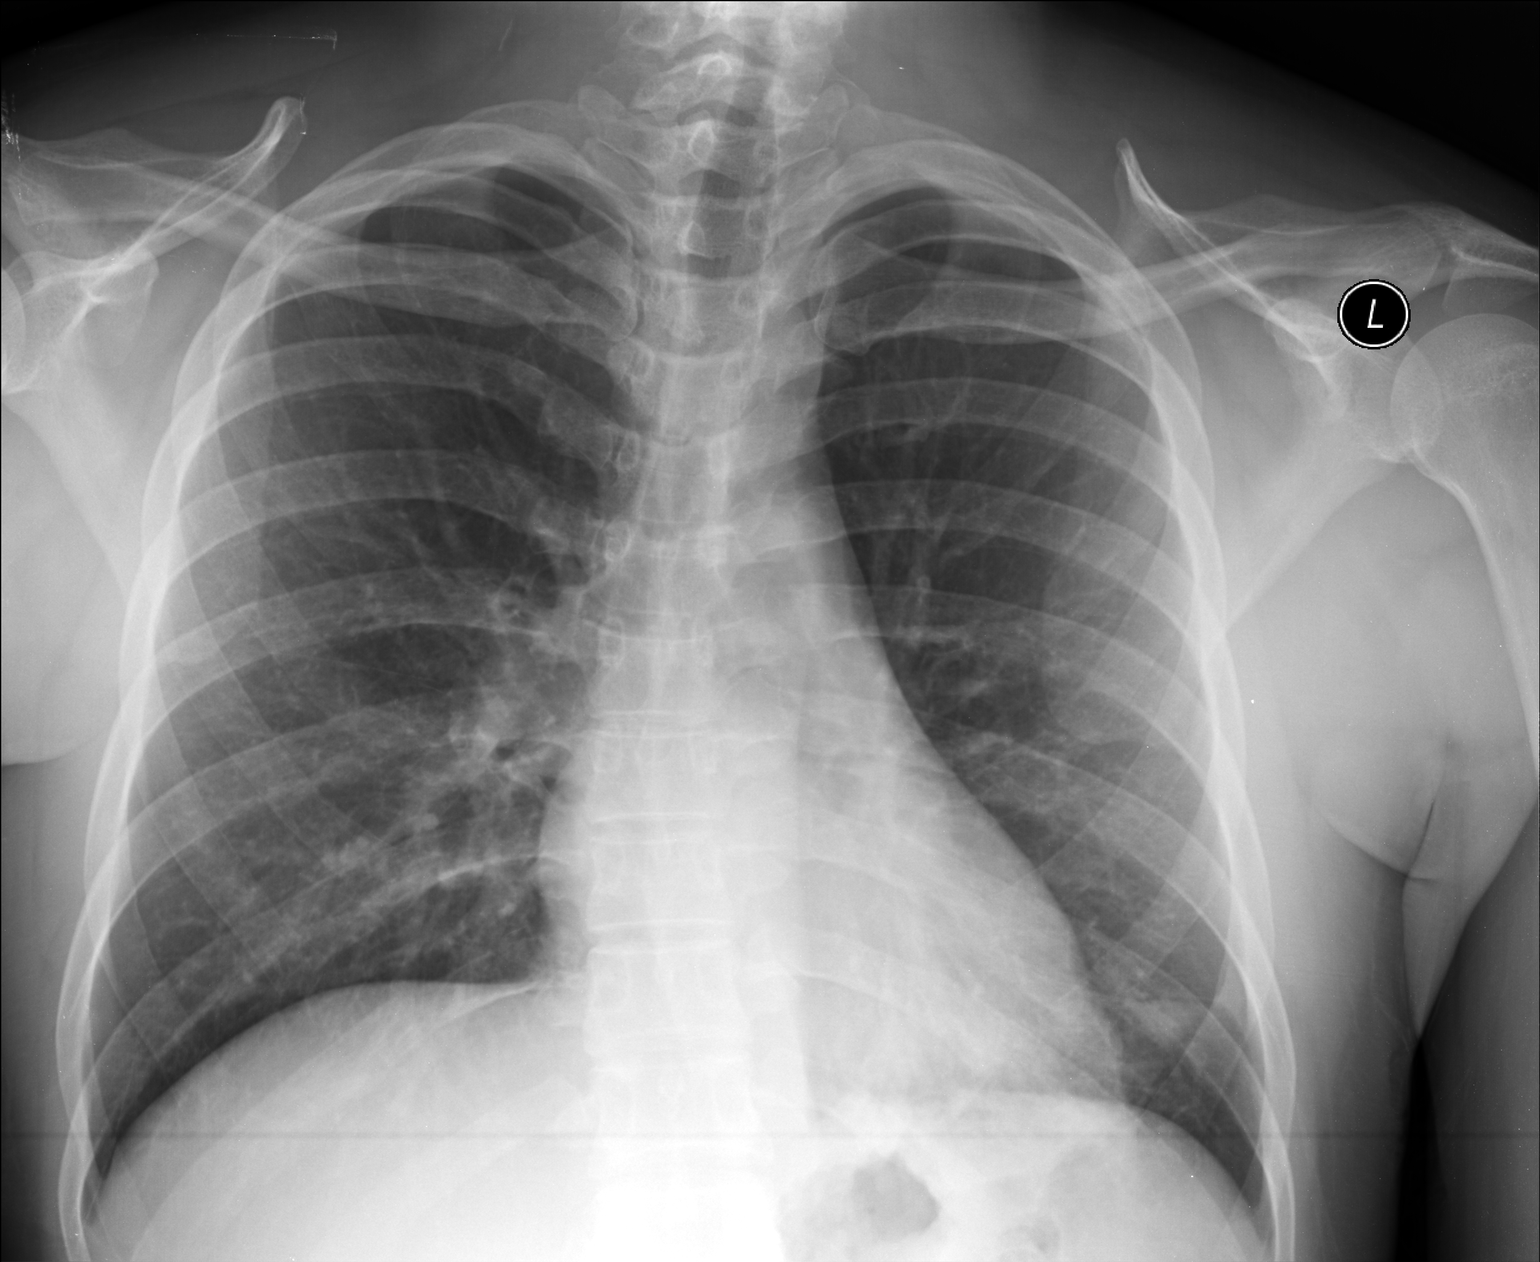

[lateral]
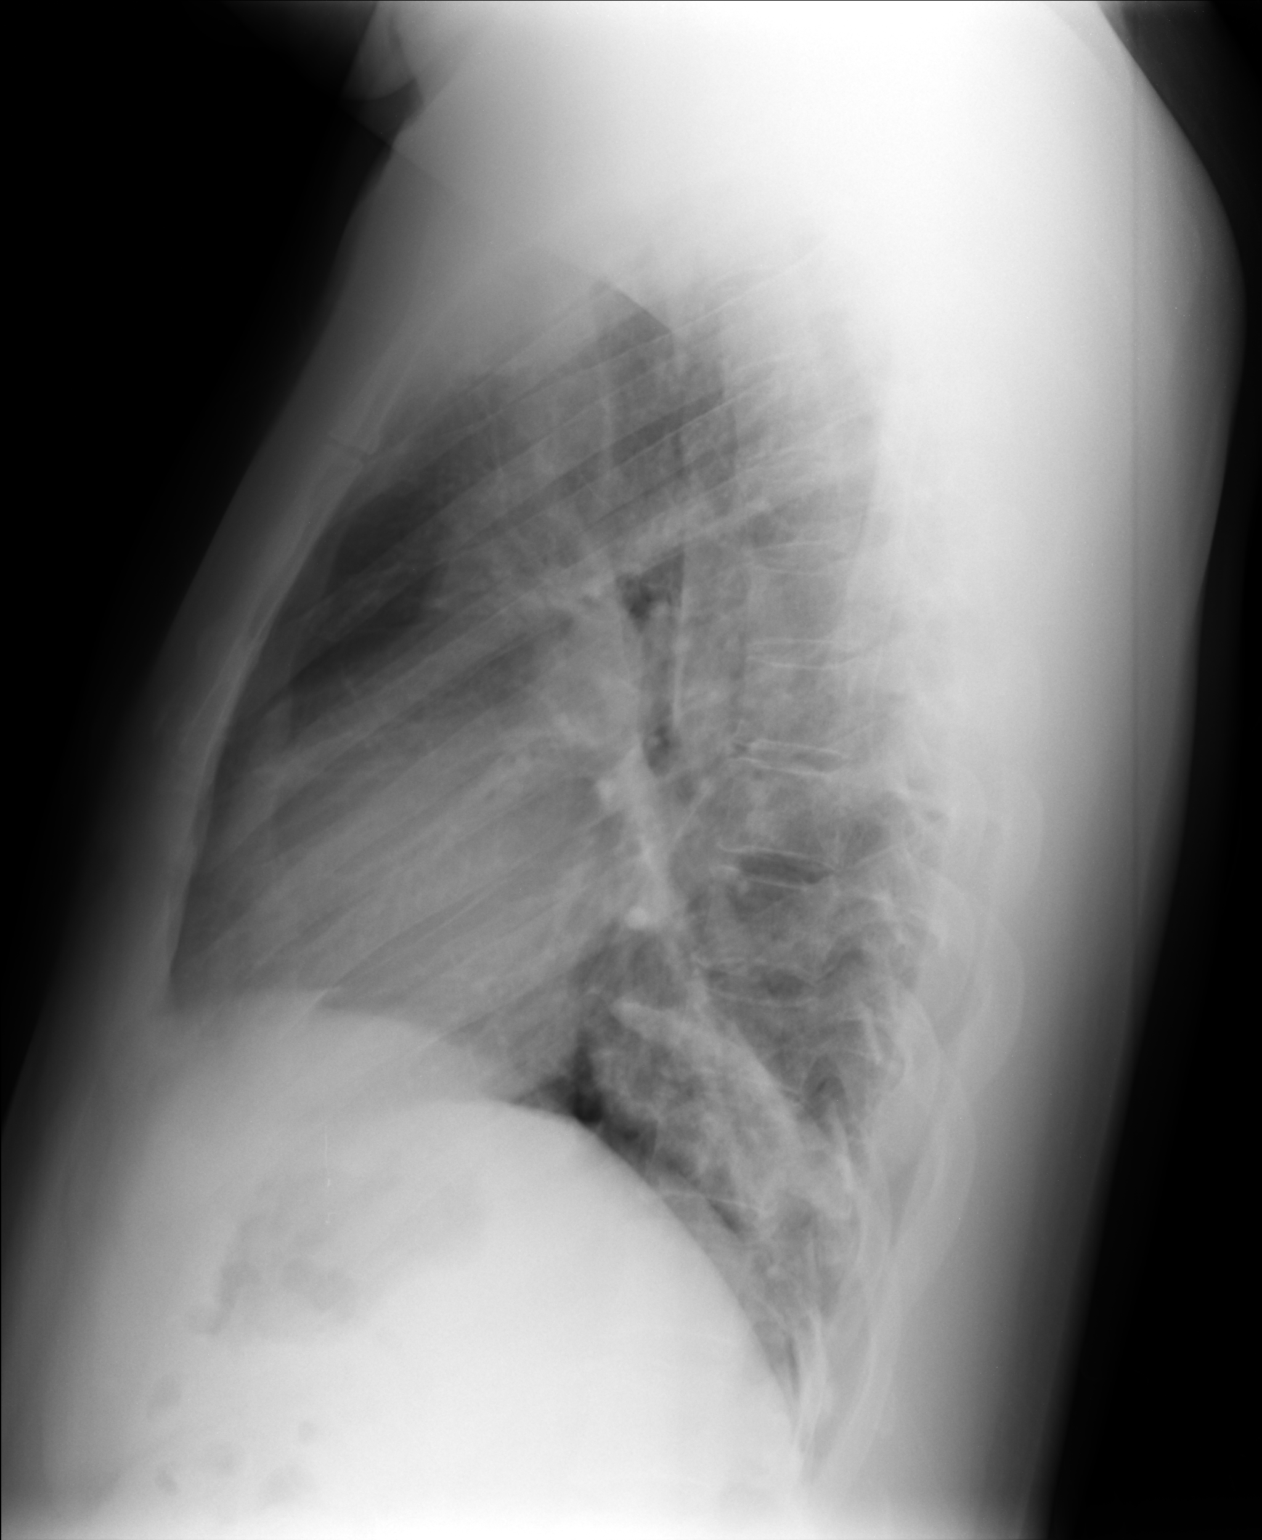

[2 of 2 positions shown; findings below may reference images not displayed]

FINDINGS: The lungs are well-expanded. There is increased alveolar opacity in
the left lower lobe consistent with pneumonia. There is patchy
density in the right infrahilar region that is slightly more
conspicuous. Subtle nodularity lateral to the cardiac apex is
slightly more conspicuous.

The cardiac silhouette and pulmonary vascularity are normal. The
mediastinum is normal in width. There is no pleural effusion. The
bony thorax is unremarkable.
IMPRESSION: Worsening of alveolar opacity in the left lower lobe consistent with
pneumonia. Mild right infrahilar subsegmental atelectasis is
suspected as well.

If the patient is improving clinically, follow-up radiographs in 4-6
weeks following antibiotic therapy are recommended to assure
clearing. If the patient's symptoms are currently worsening, chest
CT scanning now may be the most useful next imaging step.

These results will be called to the ordering clinician or
representative by the Radiologist Assistant, and communication
documented in the PACS or zVision Dashboard.

## 2018-07-07 ENCOUNTER — Encounter (HOSPITAL_COMMUNITY): Payer: Self-pay | Admitting: Emergency Medicine

## 2018-07-07 ENCOUNTER — Other Ambulatory Visit: Payer: Self-pay

## 2018-07-07 ENCOUNTER — Ambulatory Visit (HOSPITAL_COMMUNITY)
Admission: EM | Admit: 2018-07-07 | Discharge: 2018-07-07 | Disposition: A | Discharge status: Home / Self Care | Payer: BLUE CROSS/BLUE SHIELD | Attending: Family Medicine | Admitting: Family Medicine

## 2018-07-07 ENCOUNTER — Ambulatory Visit (INDEPENDENT_AMBULATORY_CARE_PROVIDER_SITE_OTHER): Payer: BLUE CROSS/BLUE SHIELD

## 2018-07-07 DIAGNOSIS — R109 Unspecified abdominal pain: Secondary | ICD-10-CM | POA: Diagnosis not present

## 2018-07-07 DIAGNOSIS — R634 Abnormal weight loss: Secondary | ICD-10-CM | POA: Diagnosis not present

## 2018-07-07 DIAGNOSIS — R252 Cramp and spasm: Secondary | ICD-10-CM

## 2018-07-07 DIAGNOSIS — K5900 Constipation, unspecified: Secondary | ICD-10-CM | POA: Diagnosis not present

## 2018-07-07 NOTE — Discharge Instructions (Addendum)
Your symptoms are related to constipation The x-ray showed a moderate amount of stool and gas Start taking MiraLAX today.  You can do 2 doses a day until you get a good bowel movement.  Then I would back down to once a day or once every couple days to prevent diarrhea You may also benefit from some over-the-counter Gas-X If your symptoms continue despite treatment or your abdominal pain gets worse and you start having nausea, vomiting or worsening symptoms you need to go to the ER

## 2018-07-07 NOTE — ED Triage Notes (Signed)
Pt complains of lower abdominal cramping for over two weeks.  Pt states this has happened before but it normally only lasts 2-3 days.  He also reports loss of appetite. He reports Tums do help his symptoms.  Pt states he has lost 13 lbs in the last two weeks.  Pt reports normal BM's but a lot of gas and no urinary issues.

## 2018-07-07 NOTE — ED Provider Notes (Signed)
[facility]    CSN: [id number] Arrival date & time: 07/07/18  1749     History   Chief Complaint Chief Complaint  Patient presents with  . Abdominal Cramping  . Anorexia    HPI Albert Larsen is a 38 y.o. male.   Patient is a 38 year old male with no significant past medical history.  He presents today with approximately 2 weeks of intermittent abdominal cramping and discomfort.  Symptoms have been waxing and waning.  Symptoms are worse after a meal and early in the morning.  He is had some loss of appetite and reports he is lost 13 pounds in the last 2 weeks.  He is having bowel movements every day or 2 but reports that they are small and sometimes he goes to have a bowel movement and just has gas.  He has had more gas than normal and bloating.  Denies any problems with urination.  Denies any associated fevers, chills, nausea, vomiting, diarrhea.  Denies any rectal bleeding.  ROS per HPI      Past Medical History:  Diagnosis Date  . Allergy     Patient Active Problem List   Diagnosis Date Noted  . IFG (impaired fasting glucose) 01/15/2013    History reviewed. No pertinent surgical history.     Home Medications    Prior to Admission medications   Not on File    Family History Family History  Problem Relation Age of Onset  . Diabetes Father   . Hypertension Mother   . Hyperlipidemia Mother     Social History Social History   Tobacco Use  . Smoking status: Never Smoker  . Smokeless tobacco: Never Used  Substance Use Topics  . Alcohol use: No    Alcohol/week: 0.0 standard drinks  . Drug use: No     Allergies   Patient has no known allergies.   Review of Systems Review of Systems   Physical Exam Triage Vital Signs ED Triage Vitals  Enc Vitals Group     BP 07/07/18 0940 133/86     Pulse Rate 07/07/18 0940 74     Resp --      Temp 07/07/18 0940 98.4 F (36.9 C)     Temp Source 07/07/18 0940 Oral     SpO2 07/07/18 0940  98 %     Weight --      Height --      Head Circumference --      Peak Flow --      Pain Score 07/07/18 0938 6     Pain Loc --      Pain Edu? --      Excl. in GC? --    No data found.  Updated Vital Signs BP 133/86 (BP Location: Right Arm)   Pulse 74   Temp 98.4 F (36.9 C) (Oral)   SpO2 98%   Visual Acuity Right Eye Distance:   Left Eye Distance:   Bilateral Distance:    Right Eye Near:   Left Eye Near:    Bilateral Near:     Physical Exam Vitals signs and nursing note reviewed.  Constitutional:      General: He is not in acute distress.    Appearance: Normal appearance. He is not ill-appearing, toxic-appearing or diaphoretic.  HENT:     Head: Normocephalic and atraumatic.     Nose: Nose normal.  Eyes:     Conjunctiva/sclera: Conjunctivae normal.  Neck:     Musculoskeletal: Normal range  of motion.  Pulmonary:     Effort: Pulmonary effort is normal.  Abdominal:     Palpations: Abdomen is soft. There is no mass.     Tenderness: There is abdominal tenderness. There is no guarding or rebound.     Hernia: No hernia is present.     Comments: Decreased bowel sounds Abdomen appears full on palpation.  Mild tenderness to right mid abdomen.  No right lower quadrant tenderness or rebound tenderness.  No masses felt.  Musculoskeletal: Normal range of motion.  Skin:    General: Skin is warm and dry.     Findings: No rash.  Neurological:     Mental Status: He is alert.  Psychiatric:        Mood and Affect: Mood normal.      UC Treatments / Results  Labs (all labs ordered are listed, but only abnormal results are displayed) Labs Reviewed - No data to display  EKG None  Radiology Dg Abd 2 Views  Result Date: 07/07/2018 CLINICAL DATA:  38 year old male with a history of stomach pain and weight loss EXAM: ABDOMEN - 2 VIEW COMPARISON:  None. FINDINGS: Gas within stomach, small bowel, colon. No abnormal distension. No unexpected radiopaque foreign body. No  unexpected calcification or soft tissue density. No displaced fracture. IMPRESSION: Normal bowel gas pattern. Electronically Signed   By: Gilmer Mor D.O.   On: 07/07/2018 10:37    Procedures Procedures (including critical care time)  Medications Ordered in UC Medications - No data to display  Initial Impression / Assessment and Plan / UC Course  I have reviewed the triage vital signs and the nursing notes.  Pertinent labs & imaging results that were available during my care of the patient were reviewed by me and considered in my medical decision making (see chart for details).     X ray showed some gas and stool burden.  We will treat for constipation.  I do not believe there is any concern today for bowel obstruction or appendicitis or any acute abdomen.  His vital signs are stable and he is nontoxic or ill-appearing.  His abdomen was nonspecifically tender on exam, soft  MiraLAX twice a day until good bowel movement then back off Recommend using Gas-X Should precautions and instructions that if his symptoms continue or worsen despite treatment he will need to go to the hospital for further evaluation management  Final Clinical Impressions(s) / UC Diagnoses   Final diagnoses:  Constipation, unspecified constipation type     Discharge Instructions     Your symptoms are related to constipation The x-ray showed a moderate amount of stool and gas Start taking MiraLAX today.  You can do 2 doses a day until you get a good bowel movement.  Then I would back down to once a day or once every couple days to prevent diarrhea You may also benefit from some over-the-counter Gas-X If your symptoms continue despite treatment or your abdominal pain gets worse and you start having nausea, vomiting or worsening symptoms you need to go to the ER    ED Prescriptions    None     Controlled Substance Prescriptions [place] Controlled Substance Registry consulted? Not Applicable   Janace Aris, NP 07/07/18 1056

## 2018-07-20 ENCOUNTER — Encounter (HOSPITAL_COMMUNITY): Payer: Self-pay | Admitting: Emergency Medicine

## 2018-07-20 ENCOUNTER — Other Ambulatory Visit: Payer: Self-pay

## 2018-07-20 ENCOUNTER — Ambulatory Visit (HOSPITAL_COMMUNITY)
Admission: EM | Admit: 2018-07-20 | Discharge: 2018-07-20 | Disposition: A | Discharge status: Home / Self Care | Payer: BLUE CROSS/BLUE SHIELD | Attending: Internal Medicine | Admitting: Internal Medicine

## 2018-07-20 DIAGNOSIS — L739 Follicular disorder, unspecified: Secondary | ICD-10-CM | POA: Diagnosis not present

## 2018-07-20 MED ORDER — CEPHALEXIN 500 MG PO CAPS: 500.0000 mg | ORAL_CAPSULE | Freq: Three times a day (TID) | ORAL | 0 refills | Status: AC

## 2018-07-20 NOTE — ED Triage Notes (Signed)
PT has two abscesses under left arm.

## 2018-07-20 NOTE — ED Provider Notes (Signed)
[facility]    CSN: [id number] Arrival date & time: 07/20/18  1137     History   Chief Complaint Chief Complaint  Patient presents with  . Abscess    HPI Albert Larsen is a 38 y.o. male with no past medical history comes to urgent care with complaints of 2 painful lesions in the left axilla region.  Symptoms started a few days ago and is gotten persistent.  He denies any fever or chills.  No discharge from the lesions.  Pain is of moderate severity.  Aggravated by palpation.  He has not tried any over-the-counter medications.Marland Kitchen.   HPI  Past Medical History:  Diagnosis Date  . Allergy     Patient Active Problem List   Diagnosis Date Noted  . IFG (impaired fasting glucose) 01/15/2013    History reviewed. No pertinent surgical history.     Home Medications    Prior to Admission medications   Medication Sig Start Date End Date Taking? Authorizing Provider  cephALEXin (KEFLEX) 500 MG capsule Take 1 capsule (500 mg total) by mouth 3 (three) times daily for 5 days. 07/20/18 07/25/18  Merrilee JanskyLamptey, Letricia Krinsky O, MD    Family History Family History  Problem Relation Age of Onset  . Diabetes Father   . Hypertension Mother   . Hyperlipidemia Mother     Social History Social History   Tobacco Use  . Smoking status: Never Smoker  . Smokeless tobacco: Never Used  Substance Use Topics  . Alcohol use: No    Alcohol/week: 0.0 standard drinks  . Drug use: No     Allergies   Patient has no known allergies.   Review of Systems Review of Systems  Constitutional: Negative for activity change, appetite change, chills, fatigue and fever.  HENT: Negative for congestion, mouth sores, rhinorrhea, sinus pressure and sinus pain.   Eyes: Negative.   Respiratory: Negative for apnea, cough, shortness of breath and wheezing.   Gastrointestinal: Negative.  Negative for abdominal distention, abdominal pain, diarrhea and nausea.  Endocrine: Negative.   Genitourinary:  Negative.   Musculoskeletal: Negative.   Skin: Negative.   Allergic/Immunologic: Negative.   Neurological: Negative.   Psychiatric/Behavioral: Negative.      Physical Exam Triage Vital Signs ED Triage Vitals  Enc Vitals Group     BP 07/20/18 1224 130/81     Pulse Rate 07/20/18 1224 73     Resp 07/20/18 1224 16     Temp 07/20/18 1224 98.1 F (36.7 C)     Temp Source 07/20/18 1224 Oral     SpO2 07/20/18 1224 98 %     Weight --      Height --      Head Circumference --      Peak Flow --      Pain Score 07/20/18 1225 10     Pain Loc --      Pain Edu? --      Excl. in GC? --    No data found.  Updated Vital Signs BP 130/81   Pulse 73   Temp 98.1 F (36.7 C) (Oral)   Resp 16   SpO2 98%   Visual Acuity Right Eye Distance:   Left Eye Distance:   Bilateral Distance:    Right Eye Near:   Left Eye Near:    Bilateral Near:     Physical Exam Constitutional:      General: He is not in acute distress.    Appearance: Normal appearance. He  is not ill-appearing or toxic-appearing.  HENT:     Nose: Nose normal.     Mouth/Throat:     Mouth: Mucous membranes are moist.     Pharynx: No oropharyngeal exudate.  Neck:     Musculoskeletal: Normal range of motion. No neck rigidity.  Cardiovascular:     Rate and Rhythm: Normal rate and regular rhythm.     Pulses: Normal pulses.     Heart sounds: Normal heart sounds.  Pulmonary:     Effort: Pulmonary effort is normal.     Breath sounds: Normal breath sounds.  Musculoskeletal:     Comments: Left axilla has 2 tender, indurated lesions consistent with folliculitis.  No fluctuance.  No erythema.  Patient has not shaved  Lymphadenopathy:     Cervical: No cervical adenopathy.  Skin:    Capillary Refill: Capillary refill takes less than 2 seconds.  Neurological:     General: No focal deficit present.     Mental Status: He is alert and oriented to person, place, and time.      UC Treatments / Results  Labs (all labs  ordered are listed, but only abnormal results are displayed) Labs Reviewed - No data to display  EKG None  Radiology No results found.  Procedures Procedures (including critical care time)  Medications Ordered in UC Medications - No data to display  Initial Impression / Assessment and Plan / UC Course  I have reviewed the triage vital signs and the nursing notes.  Pertinent labs & imaging results that were available during my care of the patient were reviewed by me and considered in my medical decision making (see chart for details).     1.  Folliculitis in the left axilla region: Keflex 500 mg 3 times daily for 5 days Tylenol as needed for pain Underarm hygiene emphasized. Final Clinical Impressions(s) / UC Diagnoses   Final diagnoses:  Folliculitis   Discharge Instructions   None    ED Prescriptions    Medication Sig Dispense Auth. Provider   cephALEXin (KEFLEX) 500 MG capsule Take 1 capsule (500 mg total) by mouth 3 (three) times daily for 5 days. 15 capsule Lonnie Rosado, Britta Mccreedy, MD     Controlled Substance Prescriptions [place] Controlled Substance Registry consulted? No   Merrilee Jansky, MD 07/20/18 1249

## 2018-09-26 ENCOUNTER — Other Ambulatory Visit: Payer: Self-pay

## 2018-09-26 ENCOUNTER — Ambulatory Visit (INDEPENDENT_AMBULATORY_CARE_PROVIDER_SITE_OTHER): Payer: BC Managed Care – PPO | Admitting: Registered Nurse

## 2018-09-26 ENCOUNTER — Encounter: Payer: Self-pay | Admitting: Registered Nurse

## 2018-09-26 VITALS — BP 132/83 | HR 62 | Temp 98.3°F | Resp 16 | Ht 73.23 in | Wt 214.0 lb

## 2018-09-26 DIAGNOSIS — Z13220 Encounter for screening for lipoid disorders: Secondary | ICD-10-CM

## 2018-09-26 DIAGNOSIS — Z130 Encounter for screening for diseases of the blood and blood-forming organs and certain disorders involving the immune mechanism: Secondary | ICD-10-CM

## 2018-09-26 DIAGNOSIS — Z13228 Encounter for screening for other metabolic disorders: Secondary | ICD-10-CM | POA: Diagnosis not present

## 2018-09-26 DIAGNOSIS — Z1329 Encounter for screening for other suspected endocrine disorder: Secondary | ICD-10-CM | POA: Diagnosis not present

## 2018-09-26 DIAGNOSIS — Z0000 Encounter for general adult medical examination without abnormal findings: Secondary | ICD-10-CM | POA: Diagnosis not present

## 2018-09-26 LAB — LIPID PANEL

## 2018-09-26 NOTE — Progress Notes (Signed)
Established Patient Office Visit  Subjective:  Patient ID: Albert Larsen, male    DOB: 05/18/1980  Age: 38 y.o. MRN: [id number]  CC:  Chief Complaint  Patient presents with  . Annual Exam    HPI [name] [name] presents for CPE.  He is a new patient to my care, it has been around 3 years since he has been seen in our office. As such, we did a thorough review of his medical and social history today.  He states that his health has been overall good, but he is concerned for a history of impaired fasting glucose, thyroid issue, and family history of hyperlipidemia. Because of this, he is hoping to receive routine lab work in addition to a physical exam at today's visit.  He states that he has felt "caged" since the pandemic started and that his plans have been significantly altered - his parents live in [place], and he visits them every 3-5 years for 30-40 days. This trip has been canceled, and he feels like a "refresher" for him has been lost. He denies SI. Denies need for antidepressant medication. States that overall he is happy at home, just doesn't like the change from the expected course.   Past Medical History:  Diagnosis Date  . Allergy     History reviewed. No pertinent surgical history.  Family History  Problem Relation Age of Onset  . Diabetes Father   . Hypertension Mother   . Hyperlipidemia Mother     Social History   Socioeconomic History  . Marital status: Married    Spouse name: Albert Larsen  . Number of children: 1  . Years of education: college  . Highest education level: Not on file  Occupational History  . Occupation: Dance movement psychotherapistactuary  Social Needs  . Financial resource strain: Not hard at all  . Food insecurity    Worry: Never true    Inability: Never true  . Transportation needs    Medical: No    Non-medical: No  Tobacco Use  . Smoking status: Never Smoker  . Smokeless tobacco: Never Used  Substance and Sexual Activity  . Alcohol use: Never   Alcohol/week: 0.0 standard drinks    Frequency: Never  . Drug use: No  . Sexual activity: Yes  Lifestyle  . Physical activity    Days per week: 4 days    Minutes per session: 30 min  . Stress: To some extent  Relationships  . Social Musicianconnections    Talks on phone: Three times a week    Gets together: Twice a week    Attends religious service: Never    Active member of club or organization: No    Attends meetings of clubs or organizations: Never    Relationship status: Married  . Intimate partner violence    Fear of current or ex partner: No    Emotionally abused: No    Physically abused: No    Forced sexual activity: No  Other Topics Concern  . Not on file  Social History Narrative   Lives his wife and their son.   Graduated from [employer] with a degree in Math and [employer].   Originally from [place].     No outpatient medications prior to visit.   No facility-administered medications prior to visit.     No Known Allergies  ROS Review of Systems  Constitutional: Negative.   HENT: Negative.   Eyes: Negative.   Respiratory: Negative.   Cardiovascular: Negative.  Gastrointestinal: Negative.   Endocrine: Negative.   Genitourinary: Negative.   Musculoskeletal: Negative.   Skin: Negative.   Allergic/Immunologic: Negative.   Neurological: Negative.   Hematological: Negative.   Psychiatric/Behavioral: Negative.   All other systems reviewed and are negative.     Objective:    Physical Exam  Constitutional: He is oriented to person, place, and time. He appears well-developed and well-nourished. No distress.  HENT:  Head: Normocephalic and atraumatic.  Right Ear: External ear normal.  Left Ear: External ear normal.  Nose: Nose normal.  Mouth/Throat: Oropharynx is clear and moist. No oropharyngeal exudate.  Eyes: Pupils are equal, round, and reactive to light. Conjunctivae and EOM are normal. Right eye exhibits no discharge. Left eye exhibits  no discharge. No scleral icterus.  Neck: Normal range of motion. Neck supple. No tracheal deviation present. No thyromegaly present.  Cardiovascular: Normal rate, regular rhythm, normal heart sounds and intact distal pulses. Exam reveals no gallop and no friction rub.  No murmur heard. Pulmonary/Chest: Effort normal and breath sounds normal. No respiratory distress. He has no wheezes. He has no rales. He exhibits no tenderness.  Abdominal: Soft. Bowel sounds are normal. He exhibits no distension and no mass. There is no abdominal tenderness. There is no rebound and no guarding.  Musculoskeletal: Normal range of motion.        General: No tenderness, deformity or edema.  Neurological: He is alert and oriented to person, place, and time. He has normal reflexes. He displays normal reflexes. No cranial nerve deficit. He exhibits normal muscle tone. Coordination normal.  Skin: Skin is warm and dry. No rash noted. He is not diaphoretic. No erythema. No pallor.  Psychiatric: He has a normal mood and affect. His behavior is normal. Judgment and thought content normal.  Nursing note and vitals reviewed.   BP 132/83   Pulse 62   Temp 98.3 F (36.8 C) (Oral)   Resp 16   Ht 6' 1.23" (1.86 m)   Wt 214 lb (97.1 kg)   SpO2 97%   BMI 28.06 kg/m  Wt Readings from Last 3 Encounters:  09/26/18 214 lb (97.1 kg)  06/27/15 230 lb (104.3 kg)  06/25/15 233 lb (105.7 kg)     There are no preventive care reminders to display for this patient.  There are no preventive care reminders to display for this patient.  Lab Results  Component Value Date   TSH 0.502 02/08/2014   Lab Results  Component Value Date   WBC 7.8 08/16/2014   HGB 15.8 08/16/2014   HCT 46.1 08/16/2014   MCV 80.1 08/16/2014   PLT 246 02/02/2007   Lab Results  Component Value Date   NA 141 02/08/2014   K 4.4 02/08/2014   CO2 30 02/08/2014   GLUCOSE 95 02/08/2014   BUN 10 02/08/2014   CREATININE 0.95 02/08/2014   BILITOT 0.7  02/08/2014   ALKPHOS 76 02/08/2014   AST 15 02/08/2014   ALT 17 02/08/2014   PROT 7.2 02/08/2014   ALBUMIN 4.4 02/08/2014   CALCIUM 9.2 02/08/2014   Lab Results  Component Value Date   CHOL 137 02/08/2014   Lab Results  Component Value Date   HDL 31 (L) 02/08/2014   Lab Results  Component Value Date   LDLCALC 90 02/08/2014   Lab Results  Component Value Date   TRIG 80 02/08/2014   Lab Results  Component Value Date   CHOLHDL 4.4 02/08/2014   No results found for: HGBA1C  Assessment & Plan:   Problem List Items Addressed This Visit    None    Visit Diagnoses    Screening for endocrine, metabolic and immunity disorder    -  Primary   Relevant Orders   CBC with Differential/Platelet   Comprehensive metabolic panel   Hemoglobin A1c   TSH   Lipid screening       Relevant Orders   Lipid panel   Routine general medical examination at a health care facility          No orders of the defined types were placed in this encounter.   Follow-up: Return in 1 year (on 09/26/2019).   PLAN:  Pt presents for CPE - completely normal findings on exam.  Labs ordered - will follow up as warranted.  Pt overall seems to be very healthy - follow up in 1 year for CPE or sooner based on lab work / illnesses.  Patient encouraged to call clinic with any questions, comments, or concerns.   Maximiano Coss, NP

## 2018-09-26 NOTE — Patient Instructions (Addendum)
If you have lab work done today you will be contacted with your lab results within the next 2 weeks.  If you have not heard from [place] then please contact us. The fastest way to get your results is to register for My Chart.   IF you received an x-ray today, you will receive an invoice from [facility]. Please contact [facility] at [phone] with questions or concerns regarding your invoice.   IF you received labwork today, you will receive an invoice from [place]. Please contact [facility] at [phone] with questions or concerns regarding your invoice.   Our billing staff will not be able to assist you with questions regarding bills from these companies.  You will be contacted with the lab results as soon as they are available. The fastest way to get your results is to activate your My Chart account. Instructions are located on the last page of this paperwork. If you have not heard from [place] regarding the results in 2 weeks, please contact this office.       Health Maintenance, Male A healthy lifestyle and preventive care is important for your health and wellness. Ask your health care provider about what schedule of regular examinations is right for you. What should I know about weight and diet? Eat a Healthy Diet  Eat plenty of vegetables, fruits, whole grains, low-fat dairy products, and lean protein.  Do not eat a lot of foods high in solid fats, added sugars, or salt.  Maintain a Healthy Weight Regular exercise can help you achieve or maintain a healthy weight. You should:  Do at least 150 minutes of exercise each week. The exercise should increase your heart rate and make you sweat (moderate-intensity exercise).  Do strength-training exercises at least twice a week. Watch Your Levels of Cholesterol and Blood Lipids  Have your blood tested for lipids and cholesterol every 5 years starting at 39 years of age. If you are at high risk for heart disease, you  should start having your blood tested when you are 39 years old. You may need to have your cholesterol levels checked more often if: ? Your lipid or cholesterol levels are high. ? You are older than 39 years of age. ? You are at high risk for heart disease. What should I know about cancer screening? Many types of cancers can be detected early and may often be prevented. Lung Cancer  You should be screened every year for lung cancer if: ? You are a current smoker who has smoked for at least 30 years. ? You are a former smoker who has quit within the past 15 years.  Talk to your health care provider about your screening options, when you should start screening, and how often you should be screened. Colorectal Cancer  Routine colorectal cancer screening usually begins at 39 years of age and should be repeated every 5-10 years until you are 39 years old. You may need to be screened more often if early forms of precancerous polyps or small growths are found. Your health care provider may recommend screening at an earlier age if you have risk factors for colon cancer.  Your health care provider may recommend using home test kits to check for hidden blood in the stool.  A small camera at the end of a tube can be used to examine your colon (sigmoidoscopy or colonoscopy). This checks for the earliest forms of colorectal cancer. Prostate and Testicular Cancer  Depending on your age  and overall health, your health care provider may do certain tests to screen for prostate and testicular cancer.  Talk to your health care provider about any symptoms or concerns you have about testicular or prostate cancer. Skin Cancer  Check your skin from head to toe regularly.  Tell your health care provider about any new moles or changes in moles, especially if: ? There is a change in a mole's size, shape, or color. ? You have a mole that is larger than a pencil eraser.  Always use sunscreen. Apply sunscreen  liberally and repeat throughout the day.  Protect yourself by wearing long sleeves, pants, a wide-brimmed hat, and sunglasses when outside. What should I know about heart disease, diabetes, and high blood pressure?  If you are 65-55 years of age, have your blood pressure checked every 3-5 years. If you are 29 years of age or older, have your blood pressure checked every year. You should have your blood pressure measured twice-once when you are at a hospital or clinic, and once when you are not at a hospital or clinic. Record the average of the two measurements. To check your blood pressure when you are not at a hospital or clinic, you can use: ? An automated blood pressure machine at a pharmacy. ? A home blood pressure monitor.  Talk to your health care provider about your target blood pressure.  If you are between 33-35 years old, ask your health care provider if you should take aspirin to prevent heart disease.  Have regular diabetes screenings by checking your fasting blood sugar level. ? If you are at a normal weight and have a low risk for diabetes, have this test once every three years after the age of 77. ? If you are overweight and have a high risk for diabetes, consider being tested at a younger age or more often.  A one-time screening for abdominal aortic aneurysm (AAA) by ultrasound is recommended for men aged 27-75 years who are current or former smokers. What should I know about preventing infection? Hepatitis B If you have a higher risk for hepatitis B, you should be screened for this virus. Talk with your health care provider to find out if you are at risk for hepatitis B infection. Hepatitis C Blood testing is recommended for:  Everyone born from [birth year] through 1966.  Anyone with known risk factors for hepatitis C. Sexually Transmitted Diseases (STDs)  You should be screened each year for STDs including gonorrhea and chlamydia if: ? You are sexually active and are younger  than 39 years of age. ? You are older than 39 years of age and your health care provider tells you that you are at risk for this type of infection. ? Your sexual activity has changed since you were last screened and you are at an increased risk for chlamydia or gonorrhea. Ask your health care provider if you are at risk.  Talk with your health care provider about whether you are at high risk of being infected with HIV. Your health care provider may recommend a prescription medicine to help prevent HIV infection. What else can I do?  Schedule regular health, dental, and eye exams.  Stay current with your vaccines (immunizations).  Do not use any tobacco products, such as cigarettes, chewing tobacco, and e-cigarettes. If you need help quitting, ask your health care provider.  Limit alcohol intake to no more than 2 drinks per day. One drink equals 12 ounces of beer, 5 ounces of  wine, or 1 ounces of hard liquor.  Do not use street drugs.  Do not share needles.  Ask your health care provider for help if you need support or information about quitting drugs.  Tell your health care provider if you often feel depressed.  Tell your health care provider if you have ever been abused or do not feel safe at home. This information is not intended to replace advice given to you by your health care provider. Make sure you discuss any questions you have with your health care provider. Document Released: 09/25/2007 Document Revised: 11/26/2015 Document Reviewed: 12/31/2014 Elsevier Interactive Patient Education  2019 [employer]     Why follow it? Research shows. . Those who follow the Mediterranean diet have a reduced risk of heart disease  . The diet is associated with a reduced incidence of Parkinson's and Alzheimer's diseases . People following the diet may have longer life expectancies and lower rates of chronic diseases  . The Dietary Guidelines for Americans recommends the Mediterranean diet  as an eating plan to promote health and prevent disease  What Is the Mediterranean Diet?  . Healthy eating plan based on typical foods and recipes of Mediterranean-style cooking . The diet is primarily a plant based diet; these foods should make up a majority of meals   Starches - Plant based foods should make up a majority of meals - They are an important sources of vitamins, minerals, energy, antioxidants, and fiber - Choose whole grains, foods high in fiber and minimally processed items  - Typical grain sources include wheat, oats, barley, corn, brown rice, bulgar, farro, millet, polenta, couscous  - Various types of beans include chickpeas, lentils, fava beans, black beans, white beans   Fruits  Veggies - Large quantities of antioxidant rich fruits & veggies; 6 or more servings  - Vegetables can be eaten raw or lightly drizzled with oil and cooked  - Vegetables common to the traditional Mediterranean Diet include: artichokes, arugula, beets, broccoli, brussel sprouts, cabbage, carrots, celery, collard greens, cucumbers, eggplant, kale, leeks, lemons, lettuce, mushrooms, okra, onions, peas, peppers, potatoes, pumpkin, radishes, rutabaga, shallots, spinach, sweet potatoes, turnips, zucchini - Fruits common to the Mediterranean Diet include: apples, apricots, avocados, cherries, clementines, dates, figs, grapefruits, grapes, melons, nectarines, oranges, peaches, pears, pomegranates, strawberries, tangerines  Fats - Replace butter and margarine with healthy oils, such as olive oil, canola oil, and tahini  - Limit nuts to no more than a handful a day  - Nuts include walnuts, almonds, pecans, pistachios, pine nuts  - Limit or avoid candied, honey roasted or heavily salted nuts - Olives are central to the [employer]iterranean diet - can be eaten whole or used in a variety of dishes   Meats Protein - Limiting red meat: no more than a few times a month - When eating red meat: choose lean cuts and keep the  portion to the size of deck of cards - Eggs: approx. 0 to 4 times a week  - Fish and lean poultry: at least 2 a week  - Healthy protein sources include, chicken, [place], lean beef, lamb - Increase intake of seafood such as tuna, salmon, trout, mackerel, shrimp, scallops - Avoid or limit high fat processed meats such as sausage and bacon  Dairy - Include moderate amounts of low fat dairy products  - Focus on healthy dairy such as fat free yogurt, skim milk, low or reduced fat cheese - Limit dairy products higher in fat such as whole or  2% milk, cheese, ice cream  Alcohol - Moderate amounts of red wine is ok  - No more than 5 oz daily for women (all ages) and men older than age 18  - No more than 10 oz of wine daily for men younger than 47  Other - Limit sweets and other desserts  - Use herbs and spices instead of salt to flavor foods  - Herbs and spices common to the traditional Mediterranean Diet include: basil, bay leaves, chives, cloves, cumin, fennel, garlic, lavender, marjoram, mint, oregano, parsley, pepper, rosemary, sage, savory, sumac, tarragon, thyme   It's not just a diet, it's a lifestyle:  . The Mediterranean diet includes lifestyle factors typical of those in the region  . Foods, drinks and meals are best eaten with others and savored . Daily physical activity is important for overall good health . This could be strenuous exercise like running and aerobics . This could also be more leisurely activities such as walking, housework, yard-work, or taking the stairs . Moderation is the key; a balanced and healthy diet accommodates most foods and drinks . Consider portion sizes and frequency of consumption of certain foods   Meal Ideas & Options:  . Breakfast:  o Whole wheat toast or whole wheat English muffins with peanut butter & hard boiled egg o Steel cut oats topped with apples & cinnamon and skim milk  o Fresh fruit: banana, strawberries, melon, berries, peaches   o Smoothies: strawberries, bananas, greek yogurt, peanut butter o Low fat greek yogurt with blueberries and granola  o Egg white omelet with spinach and mushrooms o Breakfast couscous: whole wheat couscous, apricots, skim milk, cranberries  . Sandwiches:  o Hummus and grilled vegetables (peppers, zucchini, squash) on whole wheat bread   o Grilled chicken on whole wheat pita with lettuce, tomatoes, cucumbers or tzatziki  o Tuna salad on whole wheat bread: tuna salad made with greek yogurt, olives, red peppers, capers, green onions o Garlic rosemary lamb pita: lamb sauted with garlic, rosemary, salt & pepper; add lettuce, cucumber, greek yogurt to pita - flavor with lemon juice and black pepper  . Seafood:  o Mediterranean grilled salmon, seasoned with garlic, basil, parsley, lemon juice and black pepper o Shrimp, lemon, and spinach whole-grain pasta salad made with low fat greek yogurt  o Seared scallops with lemon orzo  o Seared tuna steaks seasoned salt, pepper, coriander topped with tomato mixture of olives, tomatoes, olive oil, minced garlic, parsley, green onions and cappers  . Meats:  o Herbed greek chicken salad with kalamata olives, cucumber, feta  o Red bell peppers stuffed with spinach, bulgur, lean ground beef (or lentils) & topped with feta   o Kebabs: skewers of chicken, tomatoes, onions, zucchini, squash  o [place] burgers: made with red onions, mint, dill, lemon juice, feta cheese topped with roasted red peppers . Vegetarian o Cucumber salad: cucumbers, artichoke hearts, celery, red onion, feta cheese, tossed in olive oil & lemon juice  o Hummus and whole grain pita points with a greek salad (lettuce, tomato, feta, olives, cucumbers, red onion) o Lentil soup with celery, carrots made with vegetable broth, garlic, salt and pepper  o Tabouli salad: parsley, bulgur, mint, scallions, cucumbers, tomato, radishes, lemon juice, olive oil, salt and pepper.       Fat and Cholesterol  Restricted Eating Plan Eating a diet that limits fat and cholesterol may help lower your risk for heart disease and other conditions. Your body needs fat and cholesterol for  basic functions, but eating too much of these things can be harmful to your health. Your health care provider may order lab tests to check your blood fat (lipid) and cholesterol levels. This helps your health care provider understand your risk for certain conditions and whether you need to make diet changes. Work with your health care provider or dietitian to make an eating plan that is right for you. Your plan includes:  Limit your fat intake to ______% or less of your total calories a day.  Limit your saturated fat intake to ______% or less of your total calories a day.  Limit the amount of cholesterol in your diet to less than _________mg a day.  Eat ___________ g of fiber a day. What are tips for following this plan? General guidelines   If you are overweight, work with your health care provider to lose weight safely. Losing just 5-10% of your body weight can improve your overall health and help prevent diseases such as diabetes and heart disease.  Avoid: ? Foods with added sugar. ? Fried foods. ? Foods that contain partially hydrogenated oils, including stick margarine, some tub margarines, cookies, crackers, and other baked goods.  Limit alcohol intake to no more than 1 drink a day for nonpregnant women and 2 drinks a day for men. One drink equals 12 oz of beer, 5 oz of wine, or 1 oz of hard liquor. Reading food labels  Check food labels for: ? Trans fats, partially hydrogenated oils, or high amounts of saturated fat. Avoid foods that contain saturated fat and trans fat. ? The amount of cholesterol in each serving. Try to eat no more than 200 mg of cholesterol each day. ? The amount of fiber in each serving. Try to eat at least 20-30 g of fiber each day.  Choose foods with healthy fats, such  as: ? Monounsaturated and polyunsaturated fats. These include olive and canola oil, flaxseeds, walnuts, almonds, and seeds. ? Omega-3 fats. These are found in foods such as salmon, mackerel, sardines, tuna, flaxseed oil, and ground flaxseeds.  Choose grain products that have whole grains. Look for the word "whole" as the first word in the ingredient list. Cooking  Cook foods using methods other than frying. Baking, boiling, grilling, and broiling are some healthy options.  Eat more home-cooked food and less restaurant, buffet, and fast food.  Avoid cooking using saturated fats. ? Animal sources of saturated fats include meats, butter, and cream. ? Plant sources of saturated fats include palm oil, palm kernel oil, and coconut oil. Meal planning   At meals, imagine dividing your plate into fourths: ? Fill one-half of your plate with vegetables and green salads. ? Fill one-fourth of your plate with whole grains. ? Fill one-fourth of your plate with lean protein foods.  Eat fish that is high in omega-3 fats at least two times a week.  Eat more foods that contain fiber, such as whole grains, beans, apples, broccoli, carrots, peas, and barley. These foods help promote healthy cholesterol levels in the blood. Recommended foods Grains  Whole grains, such as whole wheat or whole grain breads, crackers, cereals, and pasta. Unsweetened oatmeal, bulgur, barley, quinoa, or brown rice. Corn or whole wheat flour tortillas. Vegetables  Fresh or frozen vegetables (raw, steamed, roasted, or grilled). Green salads. Fruits  All fresh, canned (in natural juice), or frozen fruits. Meats and other protein foods  Ground beef (85% or leaner), grass-fed beef, or beef trimmed of fat. Skinless chicken or [place].  Ground chicken or [place]key. Pork trimmed of fat. All fish and seafood. Egg whites. Dried beans, peas, or lentils. Unsalted nuts or seeds. Unsalted canned beans. Natural nut butters without added sugar  and oil. Dairy  Low-fat or nonfat dairy products, such as skim or 1% milk, 2% or reduced-fat cheeses, low-fat and fat-free ricotta or cottage cheese, or plain low-fat and nonfat yogurt. Fats and oils  Tub margarine without trans fats. Light or reduced-fat mayonnaise and salad dressings. Avocado. Olive, canola, sesame, or safflower oils. The items listed above may not be a complete list of recommended foods or beverages. Contact your dietitian for more options. Foods to avoid Grains  White bread. White pasta. White rice. Cornbread. Bagels, pastries, and croissants. Crackers and snack foods that contain trans fat and hydrogenated oils. Vegetables  Vegetables cooked in cheese, cream, or butter sauce. Fried vegetables. Fruits  Canned fruit in heavy syrup. Fruit in cream or butter sauce. Fried fruit. Meats and other protein foods  Fatty cuts of meat. Ribs, chicken wings, bacon, sausage, bologna, salami, chitterlings, fatback, hot dogs, bratwurst, and packaged lunch meats. Liver and organ meats. Whole eggs and egg yolks. Chicken and [place] with skin. Fried meat. Dairy  Whole or 2% milk, cream, half-and-half, and cream cheese. Whole milk cheeses. Whole-fat or sweetened yogurt. Full-fat cheeses. Nondairy creamers and whipped toppings. Processed cheese, cheese spreads, and cheese curds. Beverages  Alcohol. Sugar-sweetened drinks such as sodas, lemonade, and fruit drinks. Fats and oils  Butter, stick margarine, lard, shortening, ghee, or bacon fat. Coconut, palm kernel, and palm oils. Sweets and desserts  Corn syrup, sugars, honey, and molasses. Candy. Jam and jelly. Syrup. Sweetened cereals. Cookies, pies, cakes, donuts, muffins, and ice cream. The items listed above may not be a complete list of foods and beverages to avoid. Contact your dietitian for more information. Summary  Your body needs fat and cholesterol for basic functions. However, eating too much of these things can be harmful  to your health.  Work with your health care provider and dietitian to follow a diet low in fat and cholesterol. Doing this may help lower your risk for heart disease and other conditions.  Choose healthy fats, such as monounsaturated and polyunsaturated fats, and foods high in omega-3 fatty acids.  Eat fiber-rich foods, such as whole grains, beans, peas, fruits, and vegetables.  Limit or avoid alcohol, fried foods, and foods high in saturated fats, partially hydrogenated oils, and sugar. This information is not intended to replace advice given to you by your health care provider. Make sure you discuss any questions you have with your health care provider. Document Released: 03/29/2005 Document Revised: 12/14/2016 Document Reviewed: 12/14/2016 Elsevier Interactive Patient Education  2019 [employer]

## 2018-09-27 LAB — COMPREHENSIVE METABOLIC PANEL
ALT: 13 IU/L (ref 0–44)
AST: 11 IU/L (ref 0–40)
Albumin/Globulin Ratio: 1.6 (ref 1.2–2.2)
Albumin: 4.4 g/dL (ref 4.0–5.0)
Alkaline Phosphatase: 77 IU/L (ref 39–117)
BUN/Creatinine Ratio: 10 (ref 9–20)
BUN: 10 mg/dL (ref 6–20)
Bilirubin Total: 0.6 mg/dL (ref 0.0–1.2)
CO2: 24 mmol/L (ref 20–29)
Calcium: 9.6 mg/dL (ref 8.7–10.2)
Chloride: 103 mmol/L (ref 96–106)
Creatinine, Ser: 0.99 mg/dL (ref 0.76–1.27)
GFR calc Af Amer: 110 mL/min/{1.73_m2} (ref >59)
GFR calc non Af Amer: 96 mL/min/{1.73_m2} (ref >59)
Globulin, Total: 2.7 g/dL (ref 1.5–4.5)
Glucose: 96 mg/dL (ref 65–99)
Potassium: 4.1 mmol/L (ref 3.5–5.2)
Sodium: 142 mmol/L (ref 134–144)
Total Protein: 7.1 g/dL (ref 6.0–8.5)

## 2018-09-27 LAB — CBC WITH DIFFERENTIAL/PLATELET
Basophils Absolute: 0.1 10*3/uL (ref 0.0–0.2)
Basos: 1 %
EOS (ABSOLUTE): 0.3 10*3/uL (ref 0.0–0.4)
Eos: 6 %
Hematocrit: 46.3 % (ref 37.5–51.0)
Hemoglobin: 15.8 g/dL (ref 13.0–17.7)
Immature Grans (Abs): 0 10*3/uL (ref 0.0–0.1)
Immature Granulocytes: 0 %
Lymphocytes Absolute: 1.5 10*3/uL (ref 0.7–3.1)
Lymphs: 32 %
MCH: 28.2 pg (ref 26.6–33.0)
MCHC: 34.1 g/dL (ref 31.5–35.7)
MCV: 83 fL (ref 79–97)
Monocytes Absolute: 0.3 10*3/uL (ref 0.1–0.9)
Monocytes: 6 %
Neutrophils Absolute: 2.7 10*3/uL (ref 1.4–7.0)
Neutrophils: 55 %
Platelets: 241 10*3/uL (ref 150–450)
RBC: 5.6 x10E6/uL (ref 4.14–5.80)
RDW: 12.9 % (ref 11.6–15.4)
WBC: 4.9 10*3/uL (ref 3.4–10.8)

## 2018-09-27 LAB — LIPID PANEL
Chol/HDL Ratio: 3.9 ratio (ref 0.0–5.0)
Cholesterol, Total: 135 mg/dL (ref 100–199)
HDL: 35 mg/dL — ABNORMAL LOW (ref >39)
LDL Calculated: 86 mg/dL (ref 0–99)
Triglycerides: 72 mg/dL (ref 0–149)
VLDL Cholesterol Cal: 14 mg/dL (ref 5–40)

## 2018-09-27 LAB — HEMOGLOBIN A1C
Est. average glucose Bld gHb Est-mCnc: 111 mg/dL
Hgb A1c MFr Bld: 5.5 % (ref 4.8–5.6)

## 2018-09-27 LAB — TSH: TSH: 0.722 u[IU]/mL (ref 0.450–4.500)

## 2018-09-28 NOTE — Progress Notes (Signed)
Good morning [name],  Your lab results have returned. Overall, they're looking great - I have no major concerns at this time. The only deficit is in your HDL - this is your high density lipids, which is the "good cholesterol". Making sure this number stays at 40 or above (you're currently at 35) can be a helpful protective factor in adverse cardiac events. However, given your other lab work looking so good and your exam being completely normal, I have very limited concerns for such an event at this time. The best way to raise your HDL to a better level is through physical activity. This has been difficult for everyone with COVID-19 shutting down gyms and forcing social distancing, but if you are able, exercise 30-60 minutes each day for 3-5 days each week. This should positively affect these numbers. I know that diabetes had been another concern of yours - the diagnostic that is typically used for that is your A1c. It is representative of your blood sugar over the previous 3 months rather than just the snapshot that Glucose gives [place]. You are at 5.5%. This is towards the upper end of the normal range, however, is far from diagnostic as diabetes. Just make sure to continue a healthy diet - more than 50% vegetables and fruits, lean protein, and limit processed foods, sugary drinks, and high glycemic index foods. If you have any questions, feel free to call our office.  Thank you,  Kathrin Ruddy, NP

## 2019-10-07 DIAGNOSIS — G510 Bell's palsy: Secondary | ICD-10-CM | POA: Diagnosis not present

## 2019-10-07 DIAGNOSIS — R29810 Facial weakness: Secondary | ICD-10-CM | POA: Diagnosis not present

## 2019-10-07 DIAGNOSIS — H5789 Other specified disorders of eye and adnexa: Secondary | ICD-10-CM | POA: Diagnosis not present

## 2019-11-22 DIAGNOSIS — R0683 Snoring: Secondary | ICD-10-CM | POA: Diagnosis not present

## 2019-11-22 DIAGNOSIS — R6882 Decreased libido: Secondary | ICD-10-CM | POA: Diagnosis not present

## 2019-11-22 DIAGNOSIS — F320 Major depressive disorder, single episode, mild: Secondary | ICD-10-CM | POA: Diagnosis not present

## 2019-11-22 DIAGNOSIS — Z0000 Encounter for general adult medical examination without abnormal findings: Secondary | ICD-10-CM | POA: Diagnosis not present

## 2019-11-22 DIAGNOSIS — I10 Essential (primary) hypertension: Secondary | ICD-10-CM | POA: Diagnosis not present

## 2020-01-02 DIAGNOSIS — G478 Other sleep disorders: Secondary | ICD-10-CM | POA: Diagnosis not present

## 2020-01-02 DIAGNOSIS — Z9189 Other specified personal risk factors, not elsewhere classified: Secondary | ICD-10-CM | POA: Diagnosis not present

## 2020-01-02 DIAGNOSIS — R5383 Other fatigue: Secondary | ICD-10-CM | POA: Diagnosis not present

## 2020-01-02 DIAGNOSIS — R0683 Snoring: Secondary | ICD-10-CM | POA: Diagnosis not present

## 2020-01-03 DIAGNOSIS — F320 Major depressive disorder, single episode, mild: Secondary | ICD-10-CM | POA: Diagnosis not present

## 2020-01-03 DIAGNOSIS — R0683 Snoring: Secondary | ICD-10-CM | POA: Diagnosis not present

## 2020-01-03 DIAGNOSIS — I10 Essential (primary) hypertension: Secondary | ICD-10-CM | POA: Diagnosis not present

## 2020-01-30 DIAGNOSIS — R5383 Other fatigue: Secondary | ICD-10-CM | POA: Diagnosis not present

## 2020-01-30 DIAGNOSIS — F320 Major depressive disorder, single episode, mild: Secondary | ICD-10-CM | POA: Diagnosis not present

## 2020-01-30 DIAGNOSIS — Z9189 Other specified personal risk factors, not elsewhere classified: Secondary | ICD-10-CM | POA: Diagnosis not present

## 2020-01-30 DIAGNOSIS — E663 Overweight: Secondary | ICD-10-CM | POA: Diagnosis not present

## 2020-01-30 DIAGNOSIS — G478 Other sleep disorders: Secondary | ICD-10-CM | POA: Diagnosis not present

## 2020-01-30 DIAGNOSIS — I10 Essential (primary) hypertension: Secondary | ICD-10-CM | POA: Diagnosis not present

## 2020-01-30 DIAGNOSIS — R0683 Snoring: Secondary | ICD-10-CM | POA: Diagnosis not present

## 2020-01-30 DIAGNOSIS — R61 Generalized hyperhidrosis: Secondary | ICD-10-CM | POA: Diagnosis not present

## 2020-01-30 DIAGNOSIS — G4733 Obstructive sleep apnea (adult) (pediatric): Secondary | ICD-10-CM | POA: Diagnosis not present

## 2020-02-13 DIAGNOSIS — E663 Overweight: Secondary | ICD-10-CM | POA: Diagnosis not present

## 2020-02-13 DIAGNOSIS — I10 Essential (primary) hypertension: Secondary | ICD-10-CM | POA: Diagnosis not present

## 2020-02-13 DIAGNOSIS — G4730 Sleep apnea, unspecified: Secondary | ICD-10-CM | POA: Diagnosis not present

## 2020-03-11 DIAGNOSIS — G4730 Sleep apnea, unspecified: Secondary | ICD-10-CM | POA: Diagnosis not present

## 2020-03-11 DIAGNOSIS — F320 Major depressive disorder, single episode, mild: Secondary | ICD-10-CM | POA: Diagnosis not present

## 2020-03-11 DIAGNOSIS — I10 Essential (primary) hypertension: Secondary | ICD-10-CM | POA: Diagnosis not present

## 2020-03-15 DIAGNOSIS — Z20822 Contact with and (suspected) exposure to covid-19: Secondary | ICD-10-CM | POA: Diagnosis not present

## 2020-04-24 IMAGING — DX ABDOMEN - 2 VIEW
2 series · 2 of 2 positions shown · non-contrast
Comparison: None.

CLINICAL DATA: 38-year-old male with a history of stomach pain and
weight loss

EXAM:
ABDOMEN - 2 VIEW

[abdomen erect]
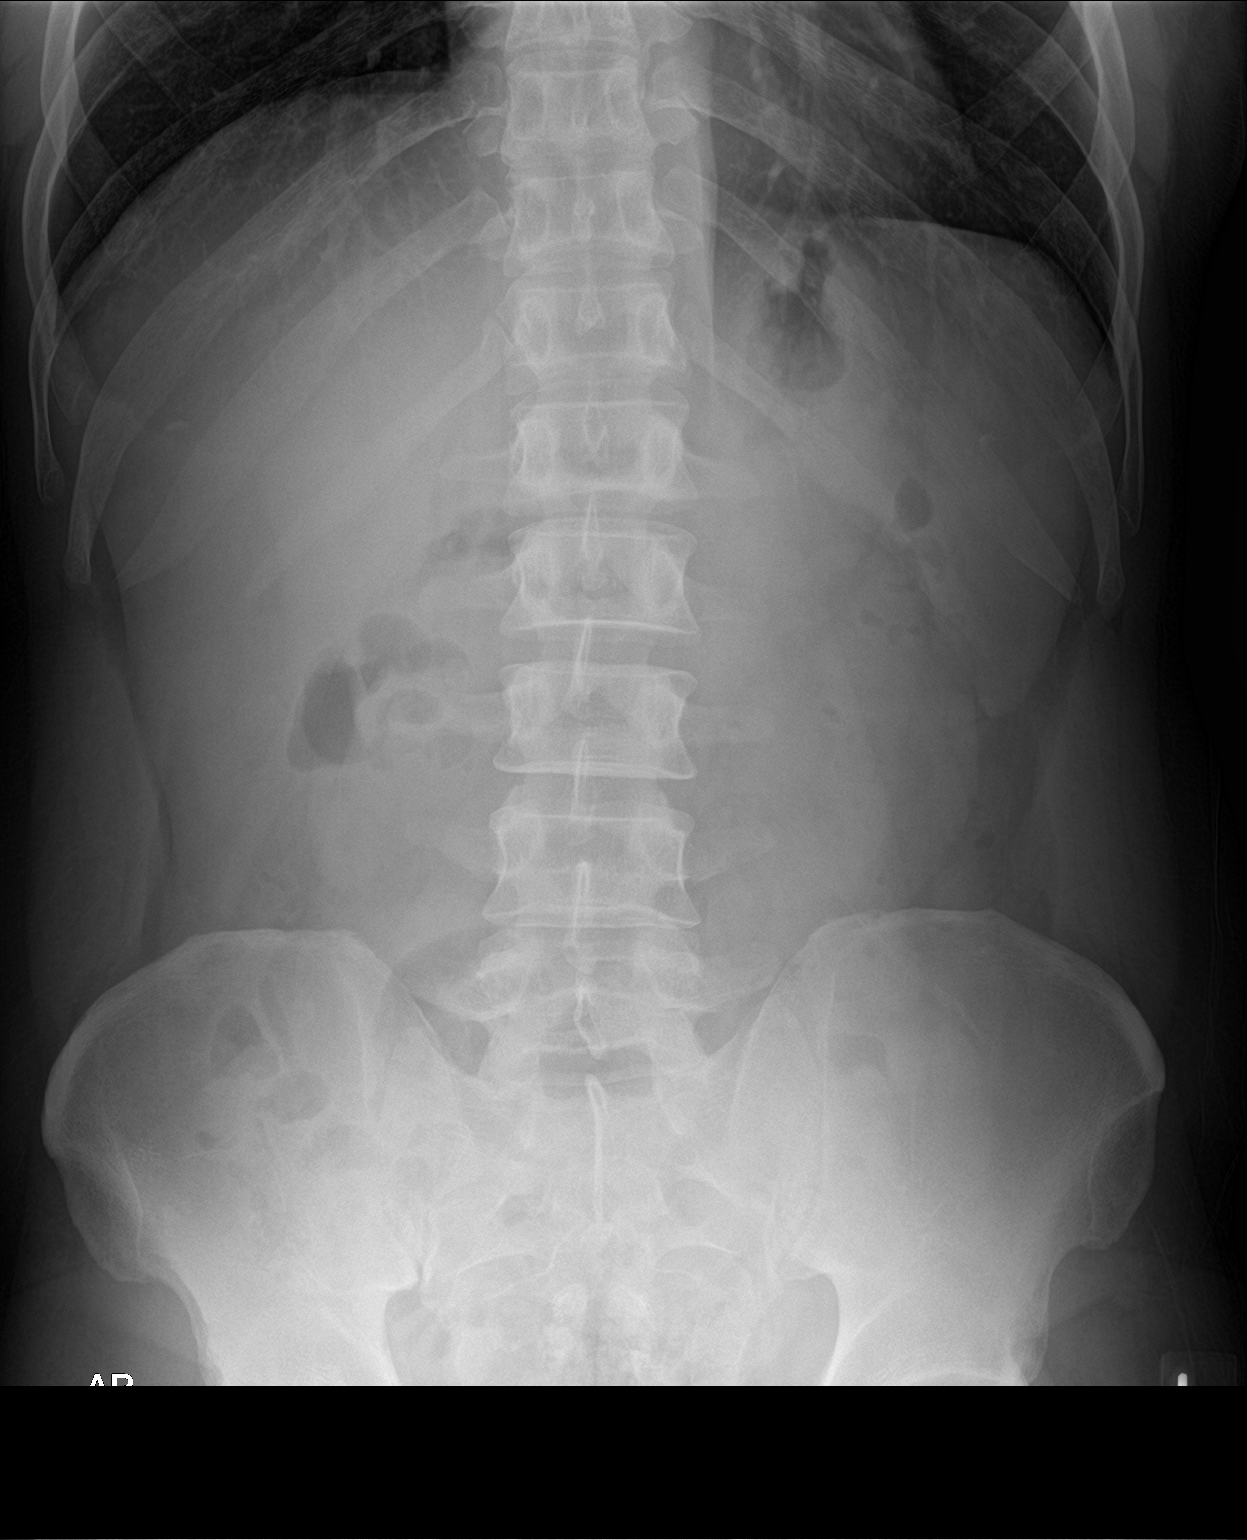

[abdomen supine]
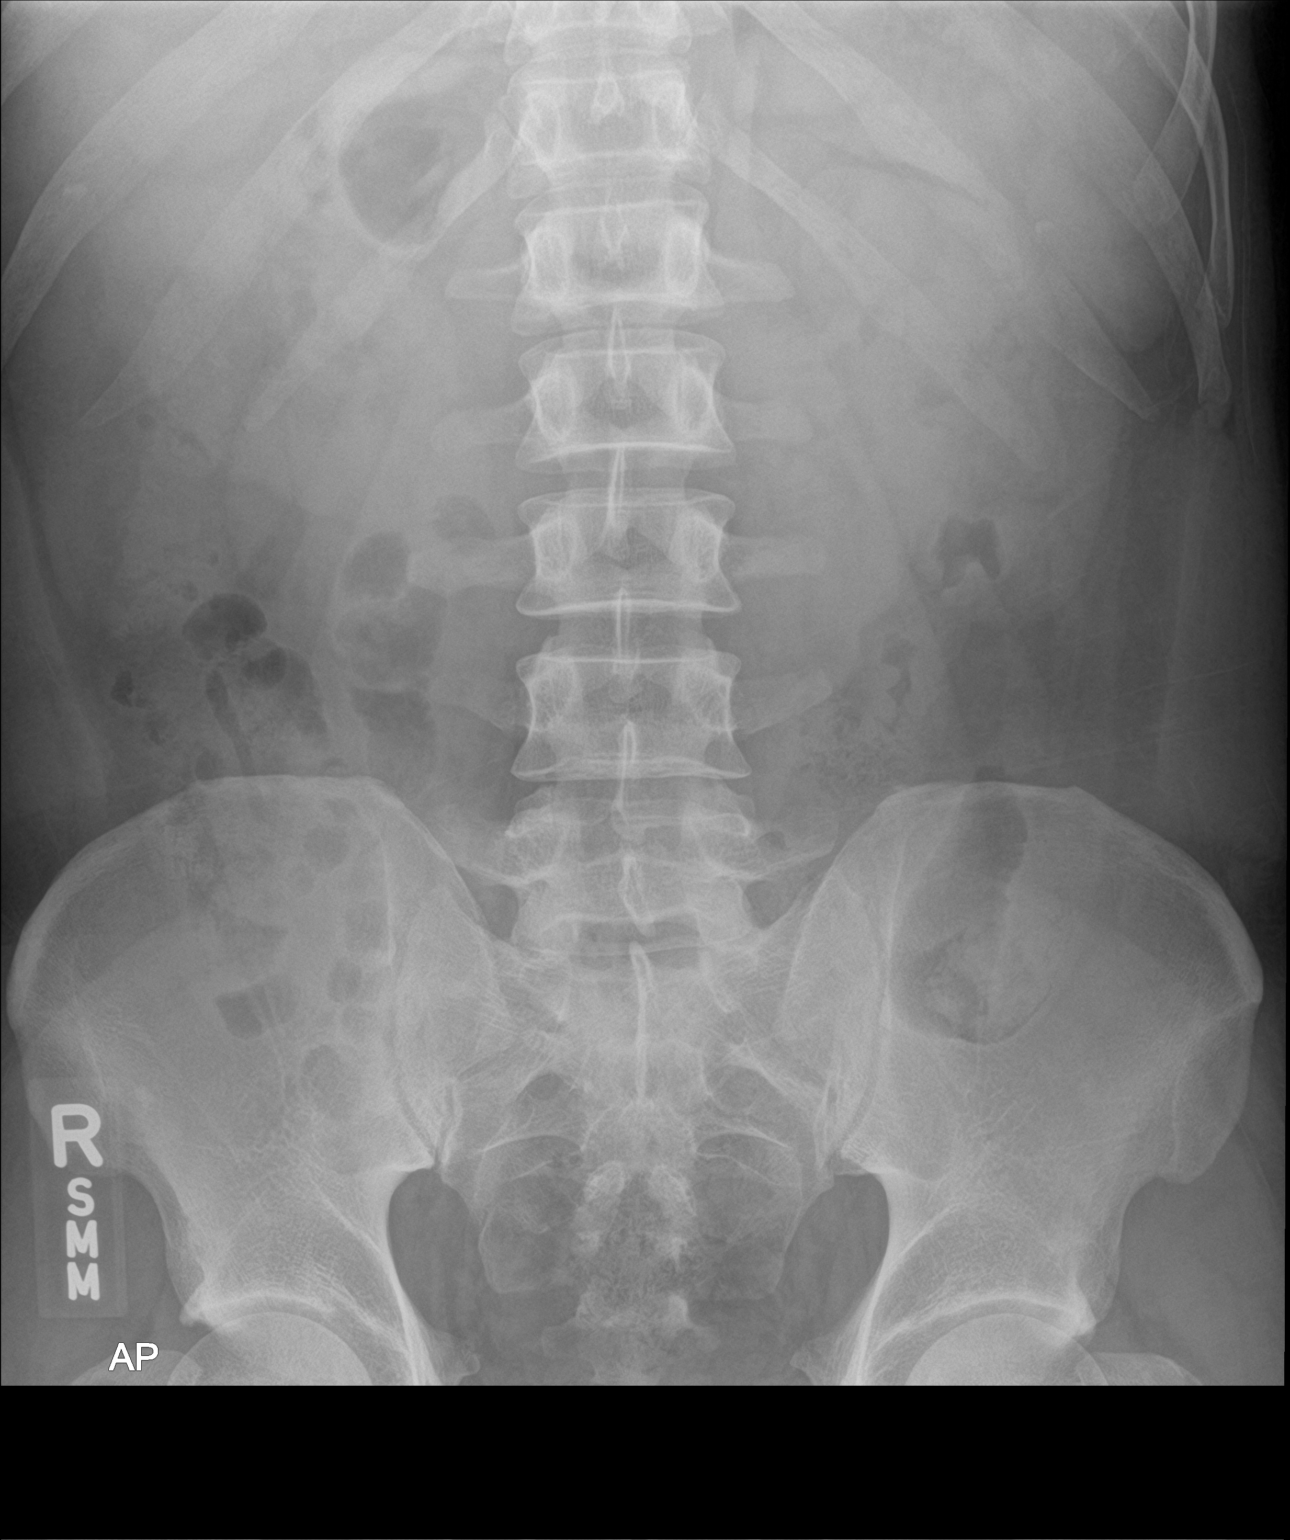

[2 of 2 positions shown; findings below may reference images not displayed]

FINDINGS: Gas within stomach, small bowel, colon. No abnormal distension. No
unexpected radiopaque foreign body. No unexpected calcification or
soft tissue density.

No displaced fracture.
IMPRESSION: Normal bowel gas pattern.
